# Patient Record
Sex: Female | Born: 1945
Health system: Southern US, Community
[De-identification: ages and names within clinical notes are randomized; demographics above are authoritative.]

## PROBLEM LIST (undated history)

## (undated) ENCOUNTER — Emergency Department (HOSPITAL_COMMUNITY): Payer: Medicare Other | Source: Home / Self Care

## (undated) DIAGNOSIS — H409 Unspecified glaucoma: Secondary | ICD-10-CM

## (undated) DIAGNOSIS — G5600 Carpal tunnel syndrome, unspecified upper limb: Secondary | ICD-10-CM

## (undated) DIAGNOSIS — M8580 Other specified disorders of bone density and structure, unspecified site: Secondary | ICD-10-CM

## (undated) DIAGNOSIS — M5090 Cervical disc disorder, unspecified, unspecified cervical region: Secondary | ICD-10-CM

## (undated) DIAGNOSIS — Z972 Presence of dental prosthetic device (complete) (partial): Secondary | ICD-10-CM

## (undated) DIAGNOSIS — M1990 Unspecified osteoarthritis, unspecified site: Secondary | ICD-10-CM

## (undated) DIAGNOSIS — M810 Age-related osteoporosis without current pathological fracture: Secondary | ICD-10-CM

## (undated) DIAGNOSIS — M4310 Spondylolisthesis, site unspecified: Secondary | ICD-10-CM

## (undated) DIAGNOSIS — D126 Benign neoplasm of colon, unspecified: Secondary | ICD-10-CM

## (undated) DIAGNOSIS — D709 Neutropenia, unspecified: Secondary | ICD-10-CM

## (undated) DIAGNOSIS — K4090 Unilateral inguinal hernia, without obstruction or gangrene, not specified as recurrent: Secondary | ICD-10-CM

## (undated) DIAGNOSIS — M7989 Other specified soft tissue disorders: Secondary | ICD-10-CM

## (undated) DIAGNOSIS — K219 Gastro-esophageal reflux disease without esophagitis: Secondary | ICD-10-CM

## (undated) HISTORY — PX: OTHER SURGICAL HISTORY: SHX169

## (undated) HISTORY — PX: ABDOMINAL HYSTERECTOMY: SHX81

## (undated) HISTORY — PX: SHOULDER SURGERY: SHX246

## (undated) HISTORY — PX: COLONOSCOPY: SHX174

## (undated) HISTORY — PX: HERNIA REPAIR: SHX51

---

## 1994-10-28 HISTORY — PX: ABDOMINAL HYSTERECTOMY: SHX81

## 1998-10-28 HISTORY — PX: HERNIA REPAIR: SHX51

## 2004-11-06 ENCOUNTER — Ambulatory Visit: Payer: Self-pay | Admitting: Obstetrics and Gynecology

## 2005-03-15 ENCOUNTER — Emergency Department: Payer: Self-pay | Admitting: Emergency Medicine

## 2006-01-28 ENCOUNTER — Ambulatory Visit: Payer: Self-pay | Admitting: Obstetrics and Gynecology

## 2007-02-03 ENCOUNTER — Ambulatory Visit: Payer: Self-pay | Admitting: Obstetrics and Gynecology

## 2007-04-07 ENCOUNTER — Ambulatory Visit: Payer: Self-pay | Admitting: Unknown Physician Specialty

## 2007-10-17 ENCOUNTER — Other Ambulatory Visit: Payer: Self-pay

## 2007-10-17 ENCOUNTER — Emergency Department: Payer: Self-pay | Admitting: Emergency Medicine

## 2007-10-29 HISTORY — PX: SHOULDER SURGERY: SHX246

## 2008-02-19 ENCOUNTER — Ambulatory Visit: Payer: Self-pay | Admitting: Obstetrics and Gynecology

## 2009-02-20 ENCOUNTER — Ambulatory Visit: Payer: Self-pay | Admitting: Obstetrics and Gynecology

## 2009-02-25 ENCOUNTER — Ambulatory Visit: Payer: Self-pay | Admitting: Internal Medicine

## 2009-02-28 ENCOUNTER — Ambulatory Visit: Payer: Self-pay | Admitting: Internal Medicine

## 2009-03-28 ENCOUNTER — Ambulatory Visit: Payer: Self-pay | Admitting: Internal Medicine

## 2009-05-28 ENCOUNTER — Ambulatory Visit: Payer: Self-pay | Admitting: Internal Medicine

## 2009-06-08 ENCOUNTER — Ambulatory Visit: Payer: Self-pay | Admitting: Internal Medicine

## 2009-06-28 ENCOUNTER — Ambulatory Visit: Payer: Self-pay | Admitting: Internal Medicine

## 2009-10-28 HISTORY — PX: BREAST EXCISIONAL BIOPSY: SUR124

## 2010-02-22 ENCOUNTER — Ambulatory Visit: Payer: Self-pay | Admitting: Obstetrics and Gynecology

## 2010-02-27 ENCOUNTER — Ambulatory Visit: Payer: Self-pay | Admitting: Obstetrics and Gynecology

## 2010-06-11 ENCOUNTER — Ambulatory Visit: Payer: Self-pay | Admitting: Obstetrics and Gynecology

## 2010-09-04 ENCOUNTER — Ambulatory Visit: Payer: Self-pay | Admitting: Surgery

## 2010-09-19 ENCOUNTER — Ambulatory Visit: Payer: Self-pay | Admitting: Surgery

## 2010-09-26 ENCOUNTER — Ambulatory Visit: Payer: Self-pay | Admitting: Surgery

## 2010-10-04 LAB — PATHOLOGY REPORT

## 2011-09-23 ENCOUNTER — Ambulatory Visit: Payer: Self-pay | Admitting: Obstetrics and Gynecology

## 2012-07-21 ENCOUNTER — Ambulatory Visit: Payer: Self-pay | Admitting: Unknown Physician Specialty

## 2012-09-23 ENCOUNTER — Ambulatory Visit: Payer: Self-pay | Admitting: Obstetrics and Gynecology

## 2013-01-04 ENCOUNTER — Emergency Department: Payer: Self-pay | Admitting: Internal Medicine

## 2013-01-04 LAB — CBC
HCT: 40 % (ref 35.0–47.0)
HGB: 12.7 g/dL (ref 12.0–16.0)
MCH: 28.1 pg (ref 26.0–34.0)
MCHC: 31.6 g/dL — ABNORMAL LOW (ref 32.0–36.0)
Platelet: 229 10*3/uL (ref 150–440)
RBC: 4.5 10*6/uL (ref 3.80–5.20)
WBC: 7.5 10*3/uL (ref 3.6–11.0)

## 2013-01-04 LAB — COMPREHENSIVE METABOLIC PANEL
Alkaline Phosphatase: 188 U/L — ABNORMAL HIGH (ref 50–136)
Anion Gap: 5 — ABNORMAL LOW (ref 7–16)
BUN: 8 mg/dL (ref 7–18)
Bilirubin,Total: 0.5 mg/dL (ref 0.2–1.0)
Calcium, Total: 8.7 mg/dL (ref 8.5–10.1)
Co2: 29 mmol/L (ref 21–32)
Creatinine: 0.83 mg/dL (ref 0.60–1.30)
EGFR (African American): >60
EGFR (Non-African Amer.): >60
Glucose: 105 mg/dL — ABNORMAL HIGH (ref 65–99)
Potassium: 3.5 mmol/L (ref 3.5–5.1)
SGPT (ALT): 56 U/L (ref 12–78)
Total Protein: 8 g/dL (ref 6.4–8.2)

## 2013-01-04 LAB — URINALYSIS, COMPLETE
Bilirubin,UR: NEGATIVE
Glucose,UR: NEGATIVE mg/dL (ref 0–75)
Ketone: NEGATIVE
Nitrite: NEGATIVE
Ph: 6 (ref 4.5–8.0)
RBC,UR: 7 /HPF (ref 0–5)
Specific Gravity: 1.014 (ref 1.003–1.030)

## 2013-01-04 LAB — TROPONIN I: Troponin-I: <0.02 ng/mL

## 2013-01-04 LAB — CK TOTAL AND CKMB (NOT AT ARMC): CK, Total: 108 U/L (ref 21–215)

## 2013-09-27 ENCOUNTER — Ambulatory Visit: Payer: Self-pay | Admitting: Obstetrics and Gynecology

## 2014-10-13 ENCOUNTER — Ambulatory Visit: Payer: Self-pay | Admitting: Obstetrics and Gynecology

## 2015-09-20 ENCOUNTER — Other Ambulatory Visit: Payer: Self-pay | Admitting: Obstetrics and Gynecology

## 2015-09-20 DIAGNOSIS — Z1231 Encounter for screening mammogram for malignant neoplasm of breast: Secondary | ICD-10-CM

## 2015-10-16 ENCOUNTER — Ambulatory Visit
Admission: RE | Admit: 2015-10-16 | Discharge: 2015-10-16 | Disposition: A | Discharge status: Home / Self Care | Payer: Medicare Other | Source: Ambulatory Visit | Attending: Obstetrics and Gynecology | Admitting: Obstetrics and Gynecology

## 2015-10-16 ENCOUNTER — Other Ambulatory Visit: Payer: Self-pay | Admitting: Obstetrics and Gynecology

## 2015-10-16 DIAGNOSIS — Z1231 Encounter for screening mammogram for malignant neoplasm of breast: Secondary | ICD-10-CM

## 2016-09-06 ENCOUNTER — Other Ambulatory Visit: Payer: Self-pay | Admitting: Obstetrics and Gynecology

## 2016-09-06 DIAGNOSIS — Z1231 Encounter for screening mammogram for malignant neoplasm of breast: Secondary | ICD-10-CM

## 2016-10-16 ENCOUNTER — Ambulatory Visit
Admission: RE | Admit: 2016-10-16 | Discharge: 2016-10-16 | Disposition: A | Discharge status: Home / Self Care | Payer: Medicare Other | Source: Ambulatory Visit | Attending: Obstetrics and Gynecology | Admitting: Obstetrics and Gynecology

## 2016-10-16 DIAGNOSIS — Z1231 Encounter for screening mammogram for malignant neoplasm of breast: Secondary | ICD-10-CM | POA: Diagnosis not present

## 2017-03-04 ENCOUNTER — Other Ambulatory Visit: Payer: Self-pay | Admitting: Obstetrics and Gynecology

## 2017-03-04 DIAGNOSIS — Z1231 Encounter for screening mammogram for malignant neoplasm of breast: Secondary | ICD-10-CM

## 2017-05-23 DIAGNOSIS — Z86010 Personal history of colonic polyps: Secondary | ICD-10-CM | POA: Insufficient documentation

## 2017-08-15 ENCOUNTER — Encounter: Payer: Self-pay | Admitting: *Deleted

## 2017-08-18 ENCOUNTER — Ambulatory Visit: Payer: Medicare Other | Admitting: Anesthesiology

## 2017-08-18 ENCOUNTER — Encounter: Payer: Self-pay | Admitting: *Deleted

## 2017-08-18 ENCOUNTER — Ambulatory Visit
Admission: RE | Admit: 2017-08-18 | Discharge: 2017-08-18 | Disposition: A | Discharge status: Home / Self Care | Payer: Medicare Other | Source: Ambulatory Visit | Attending: Unknown Physician Specialty | Admitting: Unknown Physician Specialty

## 2017-08-18 ENCOUNTER — Encounter
Admission: RE | Disposition: A | Discharge status: Home / Self Care | Payer: Self-pay | Source: Ambulatory Visit | Attending: Unknown Physician Specialty

## 2017-08-18 DIAGNOSIS — Z86010 Personal history of colonic polyps: Secondary | ICD-10-CM | POA: Insufficient documentation

## 2017-08-18 DIAGNOSIS — M810 Age-related osteoporosis without current pathological fracture: Secondary | ICD-10-CM | POA: Diagnosis not present

## 2017-08-18 DIAGNOSIS — D123 Benign neoplasm of transverse colon: Secondary | ICD-10-CM | POA: Insufficient documentation

## 2017-08-18 DIAGNOSIS — Z90710 Acquired absence of both cervix and uterus: Secondary | ICD-10-CM | POA: Insufficient documentation

## 2017-08-18 DIAGNOSIS — E669 Obesity, unspecified: Secondary | ICD-10-CM | POA: Insufficient documentation

## 2017-08-18 DIAGNOSIS — K640 First degree hemorrhoids: Secondary | ICD-10-CM | POA: Insufficient documentation

## 2017-08-18 DIAGNOSIS — Z1211 Encounter for screening for malignant neoplasm of colon: Secondary | ICD-10-CM | POA: Insufficient documentation

## 2017-08-18 DIAGNOSIS — M4310 Spondylolisthesis, site unspecified: Secondary | ICD-10-CM | POA: Insufficient documentation

## 2017-08-18 DIAGNOSIS — K635 Polyp of colon: Secondary | ICD-10-CM | POA: Insufficient documentation

## 2017-08-18 DIAGNOSIS — M8580 Other specified disorders of bone density and structure, unspecified site: Secondary | ICD-10-CM | POA: Diagnosis not present

## 2017-08-18 DIAGNOSIS — Z6830 Body mass index (BMI) 30.0-30.9, adult: Secondary | ICD-10-CM | POA: Insufficient documentation

## 2017-08-18 DIAGNOSIS — Z79899 Other long term (current) drug therapy: Secondary | ICD-10-CM | POA: Insufficient documentation

## 2017-08-18 DIAGNOSIS — K219 Gastro-esophageal reflux disease without esophagitis: Secondary | ICD-10-CM | POA: Insufficient documentation

## 2017-08-18 HISTORY — DX: Carpal tunnel syndrome, unspecified upper limb: G56.00

## 2017-08-18 HISTORY — DX: Neutropenia, unspecified: D70.9

## 2017-08-18 HISTORY — DX: Gastro-esophageal reflux disease without esophagitis: K21.9

## 2017-08-18 HISTORY — DX: Other specified disorders of bone density and structure, unspecified site: M85.80

## 2017-08-18 HISTORY — DX: Other specified soft tissue disorders: M79.89

## 2017-08-18 HISTORY — PX: COLONOSCOPY WITH PROPOFOL: SHX5780

## 2017-08-18 HISTORY — DX: Age-related osteoporosis without current pathological fracture: M81.0

## 2017-08-18 HISTORY — DX: Spondylolisthesis, site unspecified: M43.10

## 2017-08-18 HISTORY — DX: Cervical disc disorder, unspecified, unspecified cervical region: M50.90

## 2017-08-18 HISTORY — DX: Benign neoplasm of colon, unspecified: D12.6

## 2017-08-18 SURGERY — COLONOSCOPY WITH PROPOFOL
Anesthesia: General

## 2017-08-18 MED ORDER — MIDAZOLAM HCL 2 MG/2ML IJ SOLN
INTRAMUSCULAR | Status: AC
  Filled 2017-08-18: qty 2

## 2017-08-18 MED ORDER — EPHEDRINE SULFATE 50 MG/ML IJ SOLN
INTRAMUSCULAR | Status: AC
  Filled 2017-08-18: qty 1

## 2017-08-18 MED ORDER — ATROPINE SULFATE 0.4 MG/ML IJ SOLN
INTRAMUSCULAR | Status: AC
  Filled 2017-08-18: qty 1

## 2017-08-18 MED ORDER — FENTANYL CITRATE (PF) 100 MCG/2ML IJ SOLN
INTRAMUSCULAR | Status: DC | PRN
  Administered 2017-08-18: 50 ug via INTRAVENOUS

## 2017-08-18 MED ORDER — PROPOFOL 500 MG/50ML IV EMUL
INTRAVENOUS | Status: DC | PRN
  Administered 2017-08-18: 180 ug/kg/min via INTRAVENOUS

## 2017-08-18 MED ORDER — LIDOCAINE HCL (PF) 2 % IJ SOLN
INTRAMUSCULAR | Status: AC
  Filled 2017-08-18: qty 10

## 2017-08-18 MED ORDER — PROPOFOL 10 MG/ML IV BOLUS
INTRAVENOUS | Status: AC
  Filled 2017-08-18: qty 20

## 2017-08-18 MED ORDER — EPHEDRINE SULFATE 50 MG/ML IJ SOLN
INTRAMUSCULAR | Status: DC | PRN
  Administered 2017-08-18: 10 mg via INTRAVENOUS

## 2017-08-18 MED ORDER — PROPOFOL 10 MG/ML IV BOLUS
INTRAVENOUS | Status: DC | PRN
  Administered 2017-08-18: 40 mg via INTRAVENOUS

## 2017-08-18 MED ORDER — SODIUM CHLORIDE 0.9 % IV SOLN: INTRAVENOUS | Status: DC

## 2017-08-18 MED ORDER — MIDAZOLAM HCL 2 MG/2ML IJ SOLN
INTRAMUSCULAR | Status: DC | PRN
  Administered 2017-08-18: 1 mg via INTRAVENOUS

## 2017-08-18 MED ORDER — SODIUM CHLORIDE 0.9 % IV SOLN
INTRAVENOUS | Status: DC
  Administered 2017-08-18: 1000 mL via INTRAVENOUS
  Administered 2017-08-18: 08:00:00 via INTRAVENOUS

## 2017-08-18 MED ORDER — FENTANYL CITRATE (PF) 100 MCG/2ML IJ SOLN
INTRAMUSCULAR | Status: AC
  Filled 2017-08-18: qty 2

## 2017-08-18 NOTE — Anesthesia Post-op Follow-up Note (Signed)
Anesthesia QCDR form completed.        

## 2017-08-18 NOTE — Anesthesia Procedure Notes (Signed)
Date/Time: 08/18/2017 7:45 AM Performed by: Allean Found Pre-anesthesia Checklist: Patient identified, Emergency Drugs available, Suction available, Patient being monitored and Timeout performed Patient Re-evaluated:Patient Re-evaluated prior to induction Oxygen Delivery Method: Nasal cannula Placement Confirmation: positive ETCO2

## 2017-08-18 NOTE — Op Note (Signed)
[facility] Gastroenterology Patient Name: Providence Stivers Procedure Date: 08/18/2017 7:32 AM MRN: [id number] Account #: [id number] Date of Birth: Jan 02, 1946 Admit Type: Outpatient Age: 71 Room: [facility] ENDO ROOM 3 Gender: Female Note Status: Finalized Procedure:            Colonoscopy Indications:          High risk colon cancer surveillance: Personal history                        of colonic polyps Providers:            Manya Silvas, MD Referring MD:         Boykin Nearing, MD (Referring MD) Medicines:            Propofol per Anesthesia Complications:        No immediate complications. Procedure:            Pre-Anesthesia Assessment:                       - After reviewing the risks and benefits, the patient                        was deemed in satisfactory condition to undergo the                        procedure.                       After obtaining informed consent, the colonoscope was                        passed under direct vision. Throughout the procedure,                        the patient's blood pressure, pulse, and oxygen                        saturations were monitored continuously. The                        Colonoscope was introduced through the anus and                        advanced to the the cecum, identified by appendiceal                        orifice and ileocecal valve. The colonoscopy was                        performed without difficulty. The patient tolerated the                        procedure well. The quality of the bowel preparation                        was excellent. Findings:      A diminutive polyp was found in the transverse colon. The polyp was       sessile. The polyp was removed with a jumbo cold forceps. Resection and       retrieval were complete.      A diminutive polyp was  found in the transverse colon. The polyp was       sessile. The polyp was removed with a jumbo cold forceps. Resection and   retrieval were complete. To prevent bleeding after the polypectomy, one       hemostatic clip was successfully placed. There was no bleeding at the       end of the procedure.      A diminutive polyp was found in the splenic flexure. The polyp was       sessile. The polyp was removed with a jumbo cold forceps. Resection and       retrieval were complete.      Internal hemorrhoids were found during endoscopy. The hemorrhoids were       small and Grade I (internal hemorrhoids that do not prolapse).      The exam was otherwise without abnormality. Impression:           - One diminutive polyp in the transverse colon, removed                        with a jumbo cold forceps. Resected and retrieved.                       - One diminutive polyp in the transverse colon, removed                        with a jumbo cold forceps. Resected and retrieved. Clip                        was placed.                       - One diminutive polyp at the splenic flexure, removed                        with a jumbo cold forceps. Resected and retrieved.                       - Internal hemorrhoids.                       - The examination was otherwise normal. Recommendation:       - Await pathology results. Manya Silvas, MD 08/18/2017 8:05:56 AM This report has been signed electronically. Number of Addenda: 0 Note Initiated On: 08/18/2017 7:32 AM Scope Withdrawal Time: 0 hours 15 minutes 54 seconds  Total Procedure Duration: 0 hours 23 minutes 25 seconds       [facility]

## 2017-08-18 NOTE — Anesthesia Postprocedure Evaluation (Signed)
Anesthesia Post Note  Patient: Rebecca Salinas  Procedure(s) Performed: COLONOSCOPY WITH PROPOFOL (N/A )  Patient location during evaluation: Endoscopy Anesthesia Type: General Level of consciousness: awake and alert and oriented Pain management: pain level controlled Vital Signs Assessment: post-procedure vital signs reviewed and stable Respiratory status: spontaneous breathing, nonlabored ventilation and respiratory function stable Cardiovascular status: blood pressure returned to baseline and stable Postop Assessment: no signs of nausea or vomiting Anesthetic complications: no     Last Vitals:  Vitals:   08/18/17 0827 08/18/17 0837  BP: 134/83 (!) 144/82  Pulse: 65 60  Resp: 12 13  Temp:    SpO2: 100% 100%    Last Pain:  Vitals:   08/18/17 0807  TempSrc: Tympanic                 [name]

## 2017-08-18 NOTE — Transfer of Care (Signed)
Immediate Anesthesia Transfer of Care Note  Patient: Amiaya L Orona  Procedure(s) Performed: COLONOSCOPY WITH PROPOFOL (N/A )  Patient Location: PACU  Anesthesia Type:General  Level of Consciousness: sedated  Airway & Oxygen Therapy: Patient Spontanous Breathing and Patient connected to nasal cannula oxygen  Post-op Assessment: Report given to RN and Post -op Vital signs reviewed and stable  Post vital signs: Reviewed and stable  Last Vitals:  Vitals:   08/18/17 0708 08/18/17 0807  BP: (!) 163/97 113/74  Pulse: 83 84  Resp: 16 20  Temp: (!) 36.1 C (!) 35.6 C  SpO2: 96% 100%    Last Pain:  Vitals:   08/18/17 0807  TempSrc: Tympanic         Complications: No apparent anesthesia complications

## 2017-08-18 NOTE — H&P (Signed)
   Primary Care Physician:  Luna Glasgow, DO Primary Gastroenterologist:  Dr. Vira Agar  Pre-Procedure History & Physical: HPI:  Rebecca Salinas is a 71 y.o. female is here for an colonoscopy.   Past Medical History:  Diagnosis Date  . Carpal tunnel syndrome   . Cervical disc disease   . Colon adenomas   . Fat necrosis   . GERD (gastroesophageal reflux disease)   . Neutropenia ([place])   . Osteopenia   . Osteoporosis   . Spondylolisthesis     Past Surgical History:  Procedure Laterality Date  . ABDOMINAL HYSTERECTOMY    . BREAST BIOPSY Left 2011   negative  . COLONOSCOPY    . HERNIA REPAIR    . lavh    . SHOULDER SURGERY Right     Prior to Admission medications   Medication Sig Start Date End Date Taking? Authorizing Provider  esomeprazole (NEXIUM) 40 MG capsule Take 40 mg by mouth daily at 12 noon.   Yes [provider]  MULTIPLE VITAMIN PO Take 1 tablet by mouth daily.   Yes [provider]  polyethylene glycol powder (GLYCOLAX/MIRALAX) powder Take 1 Container by mouth once.   Yes [provider]  ranitidine (ZANTAC) 150 MG tablet Take 150 mg by mouth 2 (two) times daily.   Yes [provider]    Allergies as of 05/30/2017  . (Not on File)    History reviewed. No pertinent family history.  Social History   Social History  . Marital status: Married    Spouse name: N/A  . Number of children: N/A  . Years of education: N/A   Occupational History  . Not on file.   Social History Main Topics  . Smoking status: Never Smoker  . Smokeless tobacco: Never Used  . Alcohol use Yes     Comment: rare  . Drug use: No  . Sexual activity: Not on file   Other Topics Concern  . Not on file   Social History Narrative  . No narrative on file    Review of Systems: See HPI, otherwise negative ROS  Physical Exam: BP (!) 163/97   Pulse 83   Temp (!) 97 F (36.1 C) (Tympanic)   Resp 16   Ht 5\' 2"  (1.575 m)   Wt 75.3 kg (166  lb)   SpO2 96%   BMI 30.36 kg/m  General:   Alert,  pleasant and cooperative in NAD Head:  Normocephalic and atraumatic. Neck:  Supple; no masses or thyromegaly. Lungs:  Clear throughout to auscultation.    Heart:  Regular rate and rhythm. Abdomen:  Soft, nontender and nondistended. Normal bowel sounds, without guarding, and without rebound.   Neurologic:  Alert and  oriented x4;  grossly normal neurologically.  Impression/Plan: Rebecca Salinas is here for an colonoscopy to be performed for Personal history of colon polyps  Risks, benefits, limitations, and alternatives regarding  colonoscopy have been reviewed with the patient.  Questions have been answered.  All parties agreeable.   Gaylyn Cheers, MD  08/18/2017, 7:25 AM

## 2017-08-18 NOTE — Anesthesia Preprocedure Evaluation (Signed)
Anesthesia Evaluation  Patient identified by MRN, date of birth, ID band Patient awake    Reviewed: Allergy & Precautions, NPO status , Patient's Chart, lab work & pertinent test results  History of Anesthesia Complications Negative for: history of anesthetic complications  Airway Mallampati: I  TM Distance: >3 FB Neck ROM: Full    Dental  (+) Upper Dentures   Pulmonary neg pulmonary ROS, neg sleep apnea, neg COPD,    breath sounds clear to auscultation- rhonchi (-) wheezing      Cardiovascular Exercise Tolerance: Good (-) hypertension(-) CAD, (-) Past MI and (-) Cardiac Stents  Rhythm:Regular Rate:Normal - Systolic murmurs and - Diastolic murmurs    Neuro/Psych negative neurological ROS  negative psych ROS   GI/Hepatic Neg liver ROS, GERD  ,  Endo/Other  negative endocrine ROSneg diabetes  Renal/GU negative Renal ROS     Musculoskeletal negative musculoskeletal ROS (+)   Abdominal (+) + obese,   Peds  Hematology negative hematology ROS (+)   Anesthesia Other Findings Past Medical History: No date: Carpal tunnel syndrome No date: Cervical disc disease No date: Colon adenomas No date: Fat necrosis No date: GERD (gastroesophageal reflux disease) No date: Neutropenia (HCC) No date: Osteopenia No date: Osteoporosis No date: Spondylolisthesis   Reproductive/Obstetrics                             Anesthesia Physical Anesthesia Plan  ASA: II  Anesthesia Plan: General   Post-op Pain Management:    Induction: Intravenous  PONV Risk Score and Plan: 2 and Propofol infusion  Airway Management Planned: Natural Airway  Additional Equipment:   Intra-op Plan:   Post-operative Plan:   Informed Consent: I have reviewed the patients History and Physical, chart, labs and discussed the procedure including the risks, benefits and alternatives for the proposed anesthesia with the patient  or authorized representative who has indicated his/her understanding and acceptance.   Dental advisory given  Plan Discussed with: CRNA and Anesthesiologist  Anesthesia Plan Comments:         Anesthesia Quick Evaluation

## 2017-08-19 ENCOUNTER — Encounter: Payer: Self-pay | Admitting: Unknown Physician Specialty

## 2017-08-19 LAB — SURGICAL PATHOLOGY

## 2017-10-01 ENCOUNTER — Ambulatory Visit
Admission: RE | Admit: 2017-10-01 | Discharge: 2017-10-01 | Disposition: A | Discharge status: Home / Self Care | Payer: Medicare Other | Source: Ambulatory Visit | Attending: Obstetrics and Gynecology | Admitting: Obstetrics and Gynecology

## 2017-10-01 DIAGNOSIS — Z1231 Encounter for screening mammogram for malignant neoplasm of breast: Secondary | ICD-10-CM | POA: Diagnosis not present

## 2018-03-11 ENCOUNTER — Other Ambulatory Visit: Payer: Self-pay | Admitting: Obstetrics and Gynecology

## 2018-03-11 DIAGNOSIS — Z1231 Encounter for screening mammogram for malignant neoplasm of breast: Secondary | ICD-10-CM

## 2018-07-15 ENCOUNTER — Other Ambulatory Visit: Payer: Self-pay | Admitting: Nurse Practitioner

## 2018-07-15 DIAGNOSIS — R1013 Epigastric pain: Secondary | ICD-10-CM

## 2018-07-15 DIAGNOSIS — R112 Nausea with vomiting, unspecified: Secondary | ICD-10-CM

## 2018-07-21 ENCOUNTER — Ambulatory Visit
Admission: RE | Admit: 2018-07-21 | Discharge: 2018-07-21 | Disposition: A | Discharge status: Home / Self Care | Payer: Medicare Other | Source: Ambulatory Visit | Attending: Nurse Practitioner | Admitting: Nurse Practitioner

## 2018-07-21 DIAGNOSIS — R1013 Epigastric pain: Secondary | ICD-10-CM | POA: Insufficient documentation

## 2018-07-21 DIAGNOSIS — R112 Nausea with vomiting, unspecified: Secondary | ICD-10-CM | POA: Insufficient documentation

## 2018-07-21 MED ORDER — IOPAMIDOL (ISOVUE-300) INJECTION 61%
100.0000 mL | Freq: Once | INTRAVENOUS | Status: AC | PRN
  Administered 2018-07-21: 100 mL via INTRAVENOUS

## 2018-08-20 DIAGNOSIS — K219 Gastro-esophageal reflux disease without esophagitis: Secondary | ICD-10-CM | POA: Diagnosis present

## 2018-10-05 ENCOUNTER — Ambulatory Visit
Admission: RE | Admit: 2018-10-05 | Discharge: 2018-10-05 | Disposition: A | Discharge status: Home / Self Care | Payer: Medicare Other | Source: Ambulatory Visit | Attending: Obstetrics and Gynecology | Admitting: Obstetrics and Gynecology

## 2018-10-05 DIAGNOSIS — Z1231 Encounter for screening mammogram for malignant neoplasm of breast: Secondary | ICD-10-CM | POA: Diagnosis present

## 2018-10-28 HISTORY — PX: HERNIA REPAIR: SHX51

## 2018-11-05 ENCOUNTER — Ambulatory Visit: Payer: Self-pay | Admitting: Surgery

## 2018-11-05 NOTE — H&P (Signed)
Subjective:   CC:   HPI:  Rebecca Salinas is a 73 y.o. female who returns to discuss hernia repair.  Initial consult was for the incarcerated ventral hernia but since noting the incidental left recurrent inguinal, it has started to cause her more pain, especially when wearing tighter pants.   ROS:  General: Denies weight loss, weight gain, fatigue, fevers, chills, and night sweats. Heart: Denies chest pain, palpitations, racing heart, irregular heartbeat, leg pain or swelling, and decreased activity tolerance. Respiratory: Denies breathing difficulty, shortness of breath, wheezing, cough, and sputum. GI: Denies change in appetite, heartburn, nausea, vomiting, constipation, diarrhea, and blood in stool. GU: Denies difficulty urinating, pain with urinating, urgency, frequency, blood in urine   Objective:   BP (!) 157/91   Pulse 77   Temp 36.4 C (97.5 F) (Oral)   Ht 157.5 cm (5' 2.01")   Wt 73.3 kg (161 lb 9.6 oz)   BMI 29.55 kg/m   Constitutional :  alert, appears stated age, cooperative and no distress  Lymphatics/Throat:  no asymmetry, masses, or scars  Respiratory:  clear to auscultation bilaterally  Cardiovascular:  regular rate and rhythm  Gastrointestinal: soft, non-tender; bowel sounds normal; no masses,  no organomegaly. ventral hernia noted.  small, non-reducible roughly 3cm below xyphoid.  Palpable as vague lump with minimal pressure with palpation.  No palpable inguinal hernias but does have focal tenderness right over external ring on left side, not so much on right  Musculoskeletal: Steady gait and movement  Skin: Cool and moist  Psychiatric: Normal affect, non-agitated, not confused       LABS:  n/a   RADS: CT a/p report and images reviewed by myself and although report does not note it, there is an obvious ventral hernia with fat contents at the area over the palpable fullness on exam. Assessment:       Unilateral recurrent inguinal hernia without  obstruction or gangrene [K40.91]  Plan:   1. Unilateral recurrent inguinal hernia without obstruction or gangrene [K40.91]   Discussed the risk of surgery including recurrence, which can be up to 50% in the case of incisional or complex hernias, possible use of prosthetic materials (mesh) and the increased risk of mesh infxn if used, bleeding, chronic pain, post-op infxn, post-op SBO or ileus, and possible re-operation to address said risks. The risks of general anesthetic, if used, includes MI, CVA, sudden death or even reaction to anesthetic medications also discussed. Alternatives include continued observation.  Benefits include possible symptom relief, prevention of incarceration, strangulation, enlargement in size over time, and the risk of emergency surgery in the face of strangulation.   Typical post-op recovery time of 3-5 days with 4-6 weeks of activity restrictions were also discussed.  ED return precautions given for sudden increase in pain, size of hernia with accompanying fever, nausea, and/or vomiting.  The patient verbalized understanding and all questions were answered to the patient's satisfaction.   2. Do to increased symptoms left inguinal region, will proceed with left inguinal hernia first (open), then consider ventral hernia down the road after she recovers.  She verbalized understanding    Electronically signed by Benjamine Sprague, DO on 11/04/2018 3:37 PM

## 2018-11-05 NOTE — H&P (View-Only) (Signed)
Subjective:   CC:   HPI:  Rebecca Salinas is a 73 y.o. female who returns to discuss hernia repair.  Initial consult was for the incarcerated ventral hernia but since noting the incidental left recurrent inguinal, it has started to cause her more pain, especially when wearing tighter pants.   ROS:  General: Denies weight loss, weight gain, fatigue, fevers, chills, and night sweats. Heart: Denies chest pain, palpitations, racing heart, irregular heartbeat, leg pain or swelling, and decreased activity tolerance. Respiratory: Denies breathing difficulty, shortness of breath, wheezing, cough, and sputum. GI: Denies change in appetite, heartburn, nausea, vomiting, constipation, diarrhea, and blood in stool. GU: Denies difficulty urinating, pain with urinating, urgency, frequency, blood in urine   Objective:   BP (!) 157/91   Pulse 77   Temp 36.4 C (97.5 F) (Oral)   Ht 157.5 cm (5' 2.01")   Wt 73.3 kg (161 lb 9.6 oz)   BMI 29.55 kg/m   Constitutional :  alert, appears stated age, cooperative and no distress  Lymphatics/Throat:  no asymmetry, masses, or scars  Respiratory:  clear to auscultation bilaterally  Cardiovascular:  regular rate and rhythm  Gastrointestinal: soft, non-tender; bowel sounds normal; no masses,  no organomegaly. ventral hernia noted.  small, non-reducible roughly 3cm below xyphoid.  Palpable as vague lump with minimal pressure with palpation.  No palpable inguinal hernias but does have focal tenderness right over external ring on left side, not so much on right  Musculoskeletal: Steady gait and movement  Skin: Cool and moist  Psychiatric: Normal affect, non-agitated, not confused       LABS:  n/a   RADS: CT a/p report and images reviewed by myself and although report does not note it, there is an obvious ventral hernia with fat contents at the area over the palpable fullness on exam. Assessment:       Unilateral recurrent inguinal hernia without  obstruction or gangrene [K40.91]  Plan:   1. Unilateral recurrent inguinal hernia without obstruction or gangrene [K40.91]   Discussed the risk of surgery including recurrence, which can be up to 50% in the case of incisional or complex hernias, possible use of prosthetic materials (mesh) and the increased risk of mesh infxn if used, bleeding, chronic pain, post-op infxn, post-op SBO or ileus, and possible re-operation to address said risks. The risks of general anesthetic, if used, includes MI, CVA, sudden death or even reaction to anesthetic medications also discussed. Alternatives include continued observation.  Benefits include possible symptom relief, prevention of incarceration, strangulation, enlargement in size over time, and the risk of emergency surgery in the face of strangulation.   Typical post-op recovery time of 3-5 days with 4-6 weeks of activity restrictions were also discussed.  ED return precautions given for sudden increase in pain, size of hernia with accompanying fever, nausea, and/or vomiting.  The patient verbalized understanding and all questions were answered to the patient's satisfaction.   2. Do to increased symptoms left inguinal region, will proceed with left inguinal hernia first (open), then consider ventral hernia down the road after she recovers.  She verbalized understanding    Electronically signed by Benjamine Sprague, DO on 11/04/2018 3:37 PM

## 2018-11-16 ENCOUNTER — Encounter
Admission: RE | Admit: 2018-11-16 | Discharge: 2018-11-16 | Disposition: A | Discharge status: Home / Self Care | Payer: Medicare Other | Source: Ambulatory Visit | Attending: Surgery | Admitting: Surgery

## 2018-11-16 ENCOUNTER — Other Ambulatory Visit: Payer: Self-pay

## 2018-11-16 HISTORY — DX: Unspecified osteoarthritis, unspecified site: M19.90

## 2018-11-16 NOTE — Patient Instructions (Signed)
Your procedure is scheduled on: 11-19-18 THURSDAY Report to Same Day Surgery 2nd floor medical mall [facility] Entrance-take elevator on left to 2nd floor.  Check in with surgery information desk.) To find out your arrival time please call [phone] between 1PM - 3PM on 11-18-18 [facility]  Remember: Instructions that are not followed completely may result in serious medical risk, up to and including death, or upon the discretion of your surgeon and anesthesiologist your surgery may need to be rescheduled.    _x___ 1. Do not eat food after midnight the night before your procedure. NO GUM OR CANDY AFTER MIDNIGHT. You may drink clear liquids up to 2 hours before you are scheduled to arrive at the hospital for your procedure.  Do not drink clear liquids within 2 hours of your scheduled arrival to the hospital.  Clear liquids include  --Water or Apple juice without pulp  --Clear carbohydrate beverage such as ClearFast or Gatorade  --Black Coffee or Clear Tea (No milk, no creamers, do not add anything to  the coffee or Tea   ____Ensure clear carbohydrate drink on the way to the hospital for bariatric patients  ____Ensure clear carbohydrate drink 3 hours before surgery for Dr Dwyane Luo patients if physician instructed.    __x__ 2. No Alcohol for 24 hours before or after surgery.   __x__3. No Smoking or e-cigarettes for 24 prior to surgery.  Do not use any chewable tobacco products for at least 6 hour prior to surgery   ____  4. Bring all medications with you on the day of surgery if instructed.    __x__ 5. Notify your doctor if there is any change in your medical condition     (cold, fever, infections).    x___6. On the morning of surgery brush your teeth with toothpaste and water.  You may rinse your mouth with mouth wash if you wish.  Do not swallow any toothpaste or mouthwash.   Do not wear jewelry, make-up, hairpins, clips or nail polish.  Do not wear lotions, powders, or perfumes.  You may wear deodorant.  Do not shave 48 hours prior to surgery. Men may shave face and neck.  Do not bring valuables to the hospital.    [facility] is not responsible for any belongings or valuables.               Contacts, dentures or bridgework may not be worn into surgery.  Leave your suitcase in the car. After surgery it may be brought to your room.  For patients admitted to the hospital, discharge time is determined by your treatment team.  _  Patients discharged the day of surgery will not be allowed to drive home.  You will need someone to drive you home and stay with you the night of your procedure.    Please read over the following fact sheets that you were given:   [facility] Preparing for Surgery   _x___ TAKE THE FOLLOWING MEDICATION THE MORNING OF SURGERY WITH A SMALL SIP OF WATER. These include:  1. [place]  2. TAKE AN EXTRA NEXIUM THE NIGHT BEFORE YOUR SURGERY  3.  4.  5.  6.  ____Fleets enema or Magnesium Citrate as directed.   _x___ Use CHG Soap or sage wipes as directed on instruction sheet   ____ Use inhalers on the day of surgery and bring to hospital day of surgery  ____ Stop Metformin and Janumet 2 days prior to surgery.    ____ Take  1/2 of usual insulin dose the night before surgery and none on the morning surgery.   ____ Follow recommendations from Cardiologist, Pulmonologist or PCP regarding  stopping Aspirin, Coumadin, Plavix ,Eliquis, Effient, or Pradaxa, and Pletal.  X____Stop Anti-inflammatories such as Advil, Aleve, Ibuprofen, Motrin, Naproxen, Naprosyn, Goodies powders or aspirin products NOW-OK to take Tylenol    _x___ Stop supplements until after surgery-STOP VITAMIN C NOW-MAY RESUME AFTER SURGERY   ____ Bring C-Pap to the hospital.

## 2018-11-17 ENCOUNTER — Encounter
Admission: RE | Admit: 2018-11-17 | Discharge: 2018-11-17 | Disposition: A | Discharge status: Home / Self Care | Payer: Medicare Other | Source: Ambulatory Visit | Attending: Surgery | Admitting: Surgery

## 2018-11-17 DIAGNOSIS — Z01818 Encounter for other preprocedural examination: Secondary | ICD-10-CM

## 2018-11-17 DIAGNOSIS — K219 Gastro-esophageal reflux disease without esophagitis: Secondary | ICD-10-CM | POA: Diagnosis not present

## 2018-11-17 DIAGNOSIS — K4091 Unilateral inguinal hernia, without obstruction or gangrene, recurrent: Secondary | ICD-10-CM | POA: Diagnosis not present

## 2018-11-17 DIAGNOSIS — Z79899 Other long term (current) drug therapy: Secondary | ICD-10-CM | POA: Diagnosis not present

## 2018-11-18 ENCOUNTER — Encounter: Payer: Self-pay | Admitting: Anesthesiology

## 2018-11-18 MED ORDER — CEFAZOLIN SODIUM-DEXTROSE 2-4 GM/100ML-% IV SOLN
2.0000 g | INTRAVENOUS | Status: AC
  Administered 2018-11-19: 2 g via INTRAVENOUS

## 2018-11-19 ENCOUNTER — Ambulatory Visit: Payer: Medicare Other | Admitting: Anesthesiology

## 2018-11-19 ENCOUNTER — Ambulatory Visit
Admission: RE | Admit: 2018-11-19 | Discharge: 2018-11-19 | Disposition: A | Discharge status: Home / Self Care | Payer: Medicare Other | Attending: Surgery | Admitting: Surgery

## 2018-11-19 ENCOUNTER — Encounter: Payer: Self-pay | Admitting: Anesthesiology

## 2018-11-19 ENCOUNTER — Encounter
Admission: RE | Disposition: A | Discharge status: Home / Self Care | Payer: Self-pay | Source: Home / Self Care | Attending: Surgery

## 2018-11-19 ENCOUNTER — Other Ambulatory Visit: Payer: Self-pay

## 2018-11-19 DIAGNOSIS — K4091 Unilateral inguinal hernia, without obstruction or gangrene, recurrent: Secondary | ICD-10-CM | POA: Insufficient documentation

## 2018-11-19 DIAGNOSIS — Z79899 Other long term (current) drug therapy: Secondary | ICD-10-CM | POA: Insufficient documentation

## 2018-11-19 DIAGNOSIS — K219 Gastro-esophageal reflux disease without esophagitis: Secondary | ICD-10-CM | POA: Insufficient documentation

## 2018-11-19 HISTORY — PX: INGUINAL HERNIA REPAIR: SHX194

## 2018-11-19 SURGERY — REPAIR, HERNIA, INGUINAL, ADULT
Anesthesia: General | Laterality: Left

## 2018-11-19 MED ORDER — GLYCOPYRROLATE 0.2 MG/ML IJ SOLN
INTRAMUSCULAR | Status: DC | PRN
  Administered 2018-11-19: 0.2 mg via INTRAVENOUS

## 2018-11-19 MED ORDER — HYDROMORPHONE HCL 1 MG/ML IJ SOLN
INTRAMUSCULAR | Status: AC
  Filled 2018-11-19: qty 1

## 2018-11-19 MED ORDER — ACETAMINOPHEN 500 MG PO TABS
1000.0000 mg | ORAL_TABLET | ORAL | Status: AC
  Administered 2018-11-19: 1000 mg via ORAL

## 2018-11-19 MED ORDER — ONDANSETRON HCL 4 MG/2ML IJ SOLN: 4.0000 mg | Freq: Once | INTRAMUSCULAR | Status: DC | PRN

## 2018-11-19 MED ORDER — MIDAZOLAM HCL 2 MG/2ML IJ SOLN
INTRAMUSCULAR | Status: AC
  Filled 2018-11-19: qty 2

## 2018-11-19 MED ORDER — PROPOFOL 10 MG/ML IV BOLUS
INTRAVENOUS | Status: AC
  Filled 2018-11-19: qty 20

## 2018-11-19 MED ORDER — FENTANYL CITRATE (PF) 100 MCG/2ML IJ SOLN: 25.0000 ug | INTRAMUSCULAR | Status: DC | PRN

## 2018-11-19 MED ORDER — CEFAZOLIN SODIUM-DEXTROSE 2-4 GM/100ML-% IV SOLN
INTRAVENOUS | Status: AC
  Filled 2018-11-19: qty 100

## 2018-11-19 MED ORDER — DOCUSATE SODIUM 100 MG PO CAPS: 100.0000 mg | ORAL_CAPSULE | Freq: Two times a day (BID) | ORAL | 0 refills | Status: AC | PRN

## 2018-11-19 MED ORDER — VASOPRESSIN 20 UNIT/ML IV SOLN
INTRAVENOUS | Status: AC
  Filled 2018-11-19: qty 1

## 2018-11-19 MED ORDER — DEXAMETHASONE SODIUM PHOSPHATE 10 MG/ML IJ SOLN
INTRAMUSCULAR | Status: DC | PRN
  Administered 2018-11-19: 5 mg via INTRAVENOUS

## 2018-11-19 MED ORDER — ACETAMINOPHEN 325 MG PO TABS: 650.0000 mg | ORAL_TABLET | Freq: Three times a day (TID) | ORAL | 0 refills | Status: AC | PRN

## 2018-11-19 MED ORDER — LIDOCAINE HCL (PF) 2 % IJ SOLN
INTRAMUSCULAR | Status: AC
  Filled 2018-11-19: qty 10

## 2018-11-19 MED ORDER — CHLORHEXIDINE GLUCONATE CLOTH 2 % EX PADS: 6.0000 | MEDICATED_PAD | Freq: Once | CUTANEOUS | Status: DC

## 2018-11-19 MED ORDER — KETOROLAC TROMETHAMINE 30 MG/ML IJ SOLN
INTRAMUSCULAR | Status: DC | PRN
  Administered 2018-11-19: 30 mg via INTRAVENOUS

## 2018-11-19 MED ORDER — PHENYLEPHRINE HCL 10 MG/ML IJ SOLN
INTRAMUSCULAR | Status: DC | PRN
  Administered 2018-11-19: 100 ug via INTRAVENOUS
  Administered 2018-11-19 (×2): 200 ug via INTRAVENOUS
  Administered 2018-11-19: 50 ug via INTRAVENOUS
  Administered 2018-11-19: 200 ug via INTRAVENOUS

## 2018-11-19 MED ORDER — LIDOCAINE HCL (CARDIAC) PF 100 MG/5ML IV SOSY
PREFILLED_SYRINGE | INTRAVENOUS | Status: DC | PRN
  Administered 2018-11-19: 100 mg via INTRAVENOUS

## 2018-11-19 MED ORDER — HYDROMORPHONE HCL 1 MG/ML IJ SOLN
INTRAMUSCULAR | Status: DC | PRN
  Administered 2018-11-19: .6 mg via INTRAVENOUS

## 2018-11-19 MED ORDER — LACTATED RINGERS IV SOLN
INTRAVENOUS | Status: DC | PRN
  Administered 2018-11-19: 14:00:00 via INTRAVENOUS

## 2018-11-19 MED ORDER — SUCCINYLCHOLINE CHLORIDE 20 MG/ML IJ SOLN
INTRAMUSCULAR | Status: AC
  Filled 2018-11-19: qty 1

## 2018-11-19 MED ORDER — ACETAMINOPHEN 500 MG PO TABS
ORAL_TABLET | ORAL | Status: AC
  Filled 2018-11-19: qty 2

## 2018-11-19 MED ORDER — ROCURONIUM BROMIDE 50 MG/5ML IV SOLN
INTRAVENOUS | Status: AC
  Filled 2018-11-19: qty 1

## 2018-11-19 MED ORDER — BUPIVACAINE LIPOSOME 1.3 % IJ SUSP
INTRAMUSCULAR | Status: DC | PRN
  Administered 2018-11-19: 20 mL
  Administered 2018-11-19: 7 mL

## 2018-11-19 MED ORDER — DEXAMETHASONE SODIUM PHOSPHATE 10 MG/ML IJ SOLN
INTRAMUSCULAR | Status: AC
  Filled 2018-11-19: qty 1

## 2018-11-19 MED ORDER — ONDANSETRON HCL 4 MG/2ML IJ SOLN
INTRAMUSCULAR | Status: DC | PRN
  Administered 2018-11-19: 4 mg via INTRAVENOUS

## 2018-11-19 MED ORDER — FENTANYL CITRATE (PF) 100 MCG/2ML IJ SOLN
INTRAMUSCULAR | Status: DC | PRN
  Administered 2018-11-19 (×2): 25 ug via INTRAVENOUS

## 2018-11-19 MED ORDER — LACTATED RINGERS IV SOLN
INTRAVENOUS | Status: DC
  Administered 2018-11-19: 09:00:00 via INTRAVENOUS

## 2018-11-19 MED ORDER — PROPOFOL 10 MG/ML IV BOLUS
INTRAVENOUS | Status: DC | PRN
  Administered 2018-11-19 (×3): 50 mg via INTRAVENOUS

## 2018-11-19 MED ORDER — FENTANYL CITRATE (PF) 100 MCG/2ML IJ SOLN
INTRAMUSCULAR | Status: AC
  Filled 2018-11-19: qty 2

## 2018-11-19 MED ORDER — HYDROCODONE-ACETAMINOPHEN 5-325 MG PO TABS: 1.0000 | ORAL_TABLET | Freq: Four times a day (QID) | ORAL | 0 refills | Status: AC | PRN

## 2018-11-19 MED ORDER — ONDANSETRON HCL 4 MG/2ML IJ SOLN
INTRAMUSCULAR | Status: AC
  Filled 2018-11-19: qty 2

## 2018-11-19 MED ORDER — MIDAZOLAM HCL 2 MG/2ML IJ SOLN
INTRAMUSCULAR | Status: DC | PRN
  Administered 2018-11-19: 2 mg via INTRAVENOUS

## 2018-11-19 SURGICAL SUPPLY — 36 items
BLADE CLIPPER SURG (BLADE) ×3 IMPLANT
BLADE SURG 15 STRL LF DISP TIS (BLADE) ×1 IMPLANT
BLADE SURG 15 STRL SS (BLADE) ×2
CANISTER SUCT 1200ML W/VALVE (MISCELLANEOUS) ×3 IMPLANT
CHLORAPREP W/TINT 26ML (MISCELLANEOUS) ×3 IMPLANT
COVER WAND RF STERILE (DRAPES) IMPLANT
DERMABOND ADVANCED (GAUZE/BANDAGES/DRESSINGS) ×2
DERMABOND ADVANCED .7 DNX12 (GAUZE/BANDAGES/DRESSINGS) ×1 IMPLANT
DRAIN PENROSE 1/4X12 LTX (DRAIN) ×3 IMPLANT
DRAPE LAPAROTOMY 100X77 ABD (DRAPES) ×3 IMPLANT
ELECT REM PT RETURN 9FT ADLT (ELECTROSURGICAL) ×3
ELECTRODE REM PT RTRN 9FT ADLT (ELECTROSURGICAL) ×1 IMPLANT
GLOVE BIOGEL PI IND STRL 7.0 (GLOVE) ×1 IMPLANT
GLOVE BIOGEL PI INDICATOR 7.0 (GLOVE) ×2
GLOVE SURG SYN 7.0 (GLOVE) ×6 IMPLANT
GOWN STRL REUS W/ TWL LRG LVL3 (GOWN DISPOSABLE) ×2 IMPLANT
GOWN STRL REUS W/TWL LRG LVL3 (GOWN DISPOSABLE) ×4
LABEL OR SOLS (LABEL) ×3 IMPLANT
MESH SYNTHETIC 1.8X4 KEYHOLE S (Mesh General) ×1 IMPLANT
MESH SYNTHETIC KEYHOLE S (Mesh General) ×2 IMPLANT
NEEDLE HYPO 22GX1.5 SAFETY (NEEDLE) ×6 IMPLANT
NS IRRIG 500ML POUR BTL (IV SOLUTION) ×3 IMPLANT
PACK BASIN MINOR ARMC (MISCELLANEOUS) ×3 IMPLANT
SUT ETHIBOND NAB MO 7 #0 18IN (SUTURE) ×3 IMPLANT
SUT MNCRL 4-0 (SUTURE) ×2
SUT MNCRL 4-0 27XMFL (SUTURE) ×1
SUT SILK 3 0 12 30 (SUTURE) IMPLANT
SUT SILK 3 0 SH 30 (SUTURE) IMPLANT
SUT VIC AB 2-0 CT2 27 (SUTURE) ×3 IMPLANT
SUT VIC AB 3-0 SH 27 (SUTURE) ×2
SUT VIC AB 3-0 SH 27X BRD (SUTURE) ×1 IMPLANT
SUTURE MNCRL 4-0 27XMF (SUTURE) ×1 IMPLANT
SYR 10ML LL (SYRINGE) ×3 IMPLANT
SYR 20CC LL (SYRINGE) ×3 IMPLANT
TOWEL OR 17X26 4PK STRL BLUE (TOWEL DISPOSABLE) ×3 IMPLANT
WATER STERILE IRR 1000ML POUR (IV SOLUTION) ×3 IMPLANT

## 2018-11-19 NOTE — Anesthesia Preprocedure Evaluation (Addendum)
Anesthesia Evaluation  Patient identified by MRN, date of birth, ID band Patient awake    Reviewed: Allergy & Precautions, NPO status , Patient's Chart, lab work & pertinent test results, reviewed documented beta blocker date and time   Airway Mallampati: III  TM Distance: >3 FB     Dental  (+) Chipped, Upper Dentures, Partial Lower   Pulmonary           Cardiovascular      Neuro/Psych  Neuromuscular disease    GI/Hepatic GERD  Controlled,  Endo/Other    Renal/GU      Musculoskeletal  (+) Arthritis ,   Abdominal   Peds  Hematology   Anesthesia Other Findings EKG ok. Obese.  Reproductive/Obstetrics                            Anesthesia Physical Anesthesia Plan  ASA: III  Anesthesia Plan: General   Post-op Pain Management:    Induction: Intravenous  PONV Risk Score and Plan:   Airway Management Planned: LMA  Additional Equipment:   Intra-op Plan:   Post-operative Plan:   Informed Consent: I have reviewed the patients History and Physical, chart, labs and discussed the procedure including the risks, benefits and alternatives for the proposed anesthesia with the patient or authorized representative who has indicated his/her understanding and acceptance.       Plan Discussed with: CRNA  Anesthesia Plan Comments:         Anesthesia Quick Evaluation

## 2018-11-19 NOTE — Anesthesia Procedure Notes (Signed)
Procedure Name: LMA Insertion Date/Time: 11/19/2018 1:37 PM Performed by: Justus Memory, CRNA Pre-anesthesia Checklist: Patient identified, Patient being monitored, Timeout performed, Emergency Drugs available and Suction available Patient Re-evaluated:Patient Re-evaluated prior to induction Oxygen Delivery Method: Circle system utilized Preoxygenation: Pre-oxygenation with 100% oxygen Induction Type: IV induction Ventilation: Mask ventilation without difficulty LMA: LMA inserted LMA Size: 3.5 Tube type: Oral Number of attempts: 1 Placement Confirmation: positive ETCO2 and breath sounds checked- equal and bilateral Tube secured with: Tape Dental Injury: Teeth and Oropharynx as per pre-operative assessment

## 2018-11-19 NOTE — Interval H&P Note (Signed)
History and Physical Interval Note:  11/19/2018 1:09 PM  Rebecca Salinas  has presented today for surgery, with the diagnosis of UNILATERAL RECURRENT INGUINAL HERNIA WITHOUT OBSTRUCTION OR GANGRENE  The various methods of treatment have been discussed with the patient and family. After consideration of risks, benefits and other options for treatment, the patient has consented to  Procedure(s): HERNIA REPAIR INGUINAL ADULT (Left) as a surgical intervention .  The patient's history has been reviewed, patient examined, no change in status, stable for surgery.  I have reviewed the patient's chart and labs.  Questions were answered to the patient's satisfaction.     Elie Leppo Lysle Pearl

## 2018-11-19 NOTE — Discharge Instructions (Signed)

## 2018-11-19 NOTE — Transfer of Care (Signed)
Immediate Anesthesia Transfer of Care Note  Patient: Rebecca Salinas  Procedure(s) Performed: HERNIA REPAIR INGUINAL ADULT (Left )  Patient Location: PACU  Anesthesia Type:General  Level of Consciousness: sedated  Airway & Oxygen Therapy: Patient Spontanous Breathing and Patient connected to face mask oxygen  Post-op Assessment: Report given to RN  Post vital signs: Reviewed  Last Vitals:  Vitals Value Taken Time  BP 113/70 11/19/2018  2:44 PM  Temp    Pulse 66 11/19/2018  2:49 PM  Resp 18 11/19/2018  2:49 PM  SpO2 100 % 11/19/2018  2:49 PM  Vitals shown include unvalidated device data.  Last Pain:  Vitals:   11/19/18 0812  TempSrc: Oral  PainSc: 0-No pain         Complications: No apparent anesthesia complications

## 2018-11-19 NOTE — Op Note (Signed)
Preoperative diagnosis: left recurrent Inguinal Hernia.  Postoperative diagnosis: left recurrent   Inguinal Hernia  Procedure:  Open left recurrent  Inguinal hernia repair with mesh  Anesthesia: General, LMA  Surgeon: Dr. Lysle Pearl  Wound Classification: Clean  Specimen: none  Complications: None  Estimated Blood Loss: 69mL   Indications:  Patient is a 73 y.o. female developed a symptomatic left recurrent  inguinal hernia. Repair was indicated to avoid complications of incarceration, obstruction and pain, and a prosthetic mesh repair was elected.  Findings: 1. Small direct hernia 2. Bard shaped mesh used for repair 3. Adequate hemostasis achieved  Description of procedure: The patient was taken to the operating room. A time-out was completed verifying correct patient, procedure, site, positioning, and implant(s) and/or special equipment prior to beginning this procedure. The left groin was prepped and draped in the usual sterile fashion. An incision was marked in a natural skin crease and planned to end near the pubic tubercle.  The skin crease incision was made with a knife and deepened through Scarpa's and Camper's fascia with electrocautery until the aponeurosis of the external oblique was encountered. This was cleaned and the external ring was exposed. Hemostasis was achieved in the wound. An incision was made in the midportion of the external oblique aponeurosis in the direction of its fibers. The ilioinguinal nerve was identified and protected throughout the dissection. Flaps of the external oblique were developed cephalad and inferiorly.  A direct hernia sac was identified. The sac was carefully dissected free of the surrounding tissue down to the level of the defect and excess tissue ligated.  The defect was closed primarily with running 0 ethibond suture.  Bard shaped mesh was inserted and secured to the pubic tubercle using interrupted 0 ethibond sutures. Care was taken to assure  that the mesh was placed flat against the floor and secured around to the tissue with 0 ethibond to lay flat against the newly created floor. Hemostasis was again checked. Exparel infused as an ilioinguinal block.  The external oblique aponeurosis was closed with a running suture of 2-0 Vicryl, taking care not to catch the ilioinguinal nerve in the suture line. Scarpa's fascia was closed with interrupted 3-0 Vicryl.  The deep dermal layer closed with interrupted 3-0 Vicryl.  The skin was closed with a subcuticular stitch of Monocryl 4-0. Dermabond was applied.    The patient tolerated the procedure well and was taken to the postanesthesia care unit in stable condition. Sponge and instrument count correct at end of procedure.

## 2018-11-19 NOTE — Anesthesia Post-op Follow-up Note (Signed)
Anesthesia QCDR form completed.        

## 2018-11-20 ENCOUNTER — Encounter: Payer: Self-pay | Admitting: Surgery

## 2018-11-21 NOTE — Anesthesia Postprocedure Evaluation (Signed)
Anesthesia Post Note  Patient: Verl Blalock  Procedure(s) Performed: HERNIA REPAIR INGUINAL ADULT (Left )  Patient location during evaluation: PACU Anesthesia Type: General Level of consciousness: awake and alert Pain management: pain level controlled Vital Signs Assessment: post-procedure vital signs reviewed and stable Respiratory status: spontaneous breathing, nonlabored ventilation, respiratory function stable and patient connected to nasal cannula oxygen Cardiovascular status: blood pressure returned to baseline and stable Postop Assessment: no apparent nausea or vomiting Anesthetic complications: no     Last Vitals:  Vitals:   11/19/18 1525 11/19/18 1556  BP: 139/77 (!) 155/79  Pulse: 67 71  Resp: 16 16  Temp: (!) 36.3 C   SpO2: 96% 98%    Last Pain:  Vitals:   11/20/18 0850  TempSrc:   PainSc: 0-No pain                 Martha Clan

## 2019-09-01 ENCOUNTER — Other Ambulatory Visit: Payer: Self-pay | Admitting: Obstetrics and Gynecology

## 2019-09-01 DIAGNOSIS — Z1231 Encounter for screening mammogram for malignant neoplasm of breast: Secondary | ICD-10-CM

## 2019-10-07 ENCOUNTER — Ambulatory Visit
Admission: RE | Admit: 2019-10-07 | Discharge: 2019-10-07 | Disposition: A | Discharge status: Home / Self Care | Payer: Medicare Other | Source: Ambulatory Visit | Attending: Obstetrics and Gynecology | Admitting: Obstetrics and Gynecology

## 2019-10-07 DIAGNOSIS — Z1231 Encounter for screening mammogram for malignant neoplasm of breast: Secondary | ICD-10-CM | POA: Diagnosis not present

## 2019-10-29 DIAGNOSIS — K439 Ventral hernia without obstruction or gangrene: Secondary | ICD-10-CM

## 2019-10-29 HISTORY — DX: Ventral hernia without obstruction or gangrene: K43.9

## 2019-12-15 DIAGNOSIS — H40053 Ocular hypertension, bilateral: Secondary | ICD-10-CM | POA: Diagnosis not present

## 2020-01-03 DIAGNOSIS — H40053 Ocular hypertension, bilateral: Secondary | ICD-10-CM | POA: Diagnosis not present

## 2020-03-07 ENCOUNTER — Ambulatory Visit: Payer: Medicare Other | Attending: Internal Medicine

## 2020-03-07 DIAGNOSIS — Z23 Encounter for immunization: Secondary | ICD-10-CM

## 2020-03-07 NOTE — Progress Notes (Signed)
   Covid-19 Vaccination Clinic  Name:  Rebecca Salinas    MRN: [id number] DOB: 05/23/1946  03/07/2020  [name] was observed post Covid-19 immunization for 15 minutes without incident. She was provided with Vaccine Information Sheet and instruction to access the V-Safe system.   [name] was instructed to call 911 with any severe reactions post vaccine: Marland Kitchen Difficulty breathing  . Swelling of face and throat  . A fast heartbeat  . A bad rash all over body  . Dizziness and weakness   Immunizations Administered    Name Date Dose VIS Date Route   Pfizer COVID-19 Vaccine 03/07/2020  2:04 PM 0.3 mL 12/22/2018 Intramuscular   Manufacturer: [place]   Lot: [id number]   [place]: [id number]

## 2020-03-28 ENCOUNTER — Ambulatory Visit: Payer: Medicare Other | Attending: Internal Medicine

## 2020-03-28 ENCOUNTER — Other Ambulatory Visit: Payer: Self-pay | Admitting: Obstetrics and Gynecology

## 2020-03-28 DIAGNOSIS — Z23 Encounter for immunization: Secondary | ICD-10-CM

## 2020-03-28 DIAGNOSIS — Z1231 Encounter for screening mammogram for malignant neoplasm of breast: Secondary | ICD-10-CM

## 2020-03-28 DIAGNOSIS — K439 Ventral hernia without obstruction or gangrene: Secondary | ICD-10-CM | POA: Diagnosis not present

## 2020-03-28 DIAGNOSIS — Z124 Encounter for screening for malignant neoplasm of cervix: Secondary | ICD-10-CM | POA: Diagnosis not present

## 2020-03-28 NOTE — Progress Notes (Signed)
   Covid-19 Vaccination Clinic  Name:  Rebecca Salinas    MRN: [id number] DOB: Jun 17, 1946  03/28/2020  Rebecca Salinas was observed post Covid-19 immunization for 15 minutes without incident. She was provided with Vaccine Information Sheet and instruction to access the V-Safe system.   Rebecca Salinas was instructed to call 911 with any severe reactions post vaccine: Marland Kitchen Difficulty breathing  . Swelling of face and throat  . A fast heartbeat  . A bad rash all over body  . Dizziness and weakness   Immunizations Administered    Name Date Dose VIS Date Route   Pfizer COVID-19 Vaccine 03/28/2020  1:25 PM 0.3 mL 12/22/2018 Intramuscular   Manufacturer: [place]   Lot: [id number]   [place]: [id number]

## 2020-06-21 DIAGNOSIS — Z1211 Encounter for screening for malignant neoplasm of colon: Secondary | ICD-10-CM | POA: Diagnosis not present

## 2020-07-06 DIAGNOSIS — H2513 Age-related nuclear cataract, bilateral: Secondary | ICD-10-CM | POA: Diagnosis not present

## 2020-07-06 DIAGNOSIS — H40053 Ocular hypertension, bilateral: Secondary | ICD-10-CM | POA: Diagnosis not present

## 2020-07-29 ENCOUNTER — Emergency Department: Payer: Medicare Other

## 2020-07-29 ENCOUNTER — Inpatient Hospital Stay
Admission: EM | Admit: 2020-07-29 | Discharge: 2020-08-02 | DRG: 390 | Disposition: A | Discharge status: Home / Self Care | Payer: Medicare Other | Attending: Internal Medicine | Admitting: Internal Medicine

## 2020-07-29 ENCOUNTER — Encounter: Payer: Self-pay | Admitting: Emergency Medicine

## 2020-07-29 ENCOUNTER — Other Ambulatory Visit: Payer: Self-pay

## 2020-07-29 DIAGNOSIS — I10 Essential (primary) hypertension: Secondary | ICD-10-CM | POA: Diagnosis present

## 2020-07-29 DIAGNOSIS — K219 Gastro-esophageal reflux disease without esophagitis: Secondary | ICD-10-CM | POA: Diagnosis not present

## 2020-07-29 DIAGNOSIS — M810 Age-related osteoporosis without current pathological fracture: Secondary | ICD-10-CM | POA: Diagnosis present

## 2020-07-29 DIAGNOSIS — Z9109 Other allergy status, other than to drugs and biological substances: Secondary | ICD-10-CM

## 2020-07-29 DIAGNOSIS — R9431 Abnormal electrocardiogram [ECG] [EKG]: Secondary | ICD-10-CM | POA: Diagnosis not present

## 2020-07-29 DIAGNOSIS — K56600 Partial intestinal obstruction, unspecified as to cause: Principal | ICD-10-CM | POA: Diagnosis present

## 2020-07-29 DIAGNOSIS — K56609 Unspecified intestinal obstruction, unspecified as to partial versus complete obstruction: Secondary | ICD-10-CM | POA: Diagnosis not present

## 2020-07-29 DIAGNOSIS — R112 Nausea with vomiting, unspecified: Secondary | ICD-10-CM

## 2020-07-29 DIAGNOSIS — Z90710 Acquired absence of both cervix and uterus: Secondary | ICD-10-CM

## 2020-07-29 DIAGNOSIS — Z86010 Personal history of colonic polyps: Secondary | ICD-10-CM

## 2020-07-29 DIAGNOSIS — Z20822 Contact with and (suspected) exposure to covid-19: Secondary | ICD-10-CM | POA: Diagnosis present

## 2020-07-29 DIAGNOSIS — R109 Unspecified abdominal pain: Secondary | ICD-10-CM | POA: Diagnosis not present

## 2020-07-29 LAB — CBC
HCT: 41.4 % (ref 36.0–46.0)
HCT: 37.3 % (ref 36.0–46.0)
Hemoglobin: 13.5 g/dL (ref 12.0–15.0)
Hemoglobin: 12.8 g/dL (ref 12.0–15.0)
MCH: 28.4 pg (ref 26.0–34.0)
MCH: 28.9 pg (ref 26.0–34.0)
MCHC: 32.6 g/dL (ref 30.0–36.0)
MCHC: 34.3 g/dL (ref 30.0–36.0)
MCV: 87 fL (ref 80.0–100.0)
MCV: 84.2 fL (ref 80.0–100.0)
Platelets: 278 10*3/uL (ref 150–400)
Platelets: 251 10*3/uL (ref 150–400)
RBC: 4.76 MIL/uL (ref 3.87–5.11)
RBC: 4.43 MIL/uL (ref 3.87–5.11)
RDW: 12.6 % (ref 11.5–15.5)
RDW: 12.7 % (ref 11.5–15.5)
WBC: 7.5 10*3/uL (ref 4.0–10.5)
WBC: 7.8 10*3/uL (ref 4.0–10.5)
nRBC: 0 % (ref 0.0–0.2)
nRBC: 0 % (ref 0.0–0.2)

## 2020-07-29 LAB — COMPREHENSIVE METABOLIC PANEL
ALT: 14 U/L (ref 0–44)
AST: 20 U/L (ref 15–41)
Albumin: 4.2 g/dL (ref 3.5–5.0)
Alkaline Phosphatase: 105 U/L (ref 38–126)
Anion gap: 11 (ref 5–15)
BUN: 17 mg/dL (ref 8–23)
CO2: 25 mmol/L (ref 22–32)
Calcium: 10 mg/dL (ref 8.9–10.3)
Chloride: 99 mmol/L (ref 98–111)
Creatinine, Ser: 0.81 mg/dL (ref 0.44–1.00)
GFR calc Af Amer: >60 mL/min (ref >60)
GFR calc non Af Amer: >60 mL/min (ref >60)
Glucose, Bld: 147 mg/dL — ABNORMAL HIGH (ref 70–99)
Potassium: 3.6 mmol/L (ref 3.5–5.1)
Sodium: 135 mmol/L (ref 135–145)
Total Bilirubin: 1.3 mg/dL — ABNORMAL HIGH (ref 0.3–1.2)
Total Protein: 7.8 g/dL (ref 6.5–8.1)

## 2020-07-29 LAB — URINALYSIS, COMPLETE (UACMP) WITH MICROSCOPIC
Bacteria, UA: NONE SEEN
Bilirubin Urine: NEGATIVE
Glucose, UA: NEGATIVE mg/dL
Hgb urine dipstick: NEGATIVE
Ketones, ur: 5 mg/dL — AB
Leukocytes,Ua: NEGATIVE
Nitrite: NEGATIVE
Protein, ur: 30 mg/dL — AB
Specific Gravity, Urine: 1.03 (ref 1.005–1.030)
pH: 5 (ref 5.0–8.0)

## 2020-07-29 LAB — LIPASE, BLOOD
Lipase: 23 U/L (ref 11–51)
Lipase: 22 U/L (ref 11–51)

## 2020-07-29 LAB — MAGNESIUM: Magnesium: 2.2 mg/dL (ref 1.7–2.4)

## 2020-07-29 LAB — RESPIRATORY PANEL BY RT PCR (FLU A&B, COVID)
Influenza A by PCR: NEGATIVE
Influenza B by PCR: NEGATIVE
SARS Coronavirus 2 by RT PCR: NEGATIVE

## 2020-07-29 LAB — T4, FREE: Free T4: 0.91 ng/dL (ref 0.61–1.12)

## 2020-07-29 LAB — LACTIC ACID, PLASMA
Lactic Acid, Venous: 1.6 mmol/L (ref 0.5–1.9)
Lactic Acid, Venous: 1.5 mmol/L (ref 0.5–1.9)

## 2020-07-29 LAB — TSH: TSH: 2.026 u[IU]/mL (ref 0.350–4.500)

## 2020-07-29 LAB — CREATININE, SERUM
Creatinine, Ser: 0.73 mg/dL (ref 0.44–1.00)
GFR calc Af Amer: >60 mL/min (ref >60)
GFR calc non Af Amer: >60 mL/min (ref >60)

## 2020-07-29 MED ORDER — IOHEXOL 300 MG/ML  SOLN
100.0000 mL | Freq: Once | INTRAMUSCULAR | Status: AC | PRN
  Administered 2020-07-29: 100 mL via INTRAVENOUS

## 2020-07-29 MED ORDER — FENTANYL CITRATE (PF) 100 MCG/2ML IJ SOLN
50.0000 ug | Freq: Once | INTRAMUSCULAR | Status: AC
  Administered 2020-07-29: 50 ug via INTRAVENOUS
  Filled 2020-07-29: qty 2

## 2020-07-29 MED ORDER — LACTATED RINGERS IV SOLN: INTRAVENOUS | Status: DC

## 2020-07-29 MED ORDER — SODIUM CHLORIDE 0.9 % IV SOLN
80.0000 mg | Freq: Once | INTRAVENOUS | Status: AC
  Administered 2020-07-29: 80 mg via INTRAVENOUS
  Filled 2020-07-29: qty 80

## 2020-07-29 MED ORDER — LACTATED RINGERS IV BOLUS
1000.0000 mL | Freq: Once | INTRAVENOUS | Status: AC
  Administered 2020-07-29: 1000 mL via INTRAVENOUS

## 2020-07-29 MED ORDER — PANTOPRAZOLE SODIUM 40 MG IV SOLR
40.0000 mg | Freq: Two times a day (BID) | INTRAVENOUS | Status: DC
  Administered 2020-07-30 – 2020-08-02 (×7): 40 mg via INTRAVENOUS
  Filled 2020-07-29 (×7): qty 40

## 2020-07-29 MED ORDER — ENOXAPARIN SODIUM 40 MG/0.4ML ~~LOC~~ SOLN
40.0000 mg | SUBCUTANEOUS | Status: DC
  Administered 2020-07-29 – 2020-08-01 (×4): 40 mg via SUBCUTANEOUS
  Filled 2020-07-29 (×4): qty 0.4

## 2020-07-29 MED ORDER — ONDANSETRON 4 MG PO TBDP
4.0000 mg | ORAL_TABLET | Freq: Once | ORAL | Status: AC | PRN
  Administered 2020-07-29: 4 mg via ORAL
  Filled 2020-07-29: qty 1

## 2020-07-29 MED ORDER — ONDANSETRON HCL 4 MG/2ML IJ SOLN
4.0000 mg | Freq: Four times a day (QID) | INTRAMUSCULAR | Status: DC | PRN
  Administered 2020-07-31: 4 mg via INTRAVENOUS
  Filled 2020-07-29: qty 2

## 2020-07-29 MED ORDER — ONDANSETRON HCL 4 MG PO TABS: 4.0000 mg | ORAL_TABLET | Freq: Four times a day (QID) | ORAL | Status: DC | PRN

## 2020-07-29 MED ORDER — HYDROMORPHONE HCL 1 MG/ML IJ SOLN
1.0000 mg | Freq: Four times a day (QID) | INTRAMUSCULAR | Status: DC | PRN
  Administered 2020-07-29 – 2020-07-30 (×2): 1 mg via INTRAVENOUS
  Filled 2020-07-29 (×2): qty 1

## 2020-07-29 MED ORDER — SODIUM CHLORIDE 0.9% FLUSH
3.0000 mL | Freq: Two times a day (BID) | INTRAVENOUS | Status: DC
  Administered 2020-07-30 – 2020-08-02 (×6): 3 mL via INTRAVENOUS

## 2020-07-29 MED ORDER — SODIUM CHLORIDE 0.9 % IV SOLN: INTRAVENOUS | Status: DC

## 2020-07-29 NOTE — ED Notes (Addendum)
Pt states coming in with abdominal pain, nausea, vomiting and diarrhea. Pt states pain 5/10 Pt is on cardiac, bp and pulse ox monitor.   As per provider, repeat lactic acid is not needed.

## 2020-07-29 NOTE — ED Notes (Signed)
Pt taken to CT via stretcher.

## 2020-07-29 NOTE — H&P (Signed)
History and Physical    Rebecca Salinas LDJ:[id number] DOB: 1945-11-10 DOA: 07/29/2020  PCP: Boykin Nearing, MD    Patient coming from: Home   Chief Complaint: Nausea vomiting and abdominal pain   HPI: Rebecca Salinas is a 74 y.o. female with medical history significant of GERD, carpal tunnel syndrome, energy of disc disease of C-spine, osteopenia, osteoporosis seen in the emergency room today for nausea vomiting abdominal pain that started on Thursday. Denies any travel and eating out any fevers chills chest pain rashes or any other symptom.  Patient states that prior to Thursday past few months she has been doing okay otherwise he does not recall feeling bad in any way.  No headaches no neurological symptoms.  Since Thursday she was not able to keep anything down.  Patient does report using Goody powders, routinely but not regularly.  Also does use Nexium. Patient lives with husband and is otherwise stable. .  ED Course: Blood pressure (!) 153/73, pulse 67, temperature 98.2 F (36.8 C), temperature source Oral, resp. rate 19, height 5\' 2"  (1.575 m), weight 72.1 kg, SpO2 98 %. In the emergency room patient is alert awake oriented and reports abdominal pain that is generalized.  Patient is given IV fluids and fentanyl for her abdominal pain.  Review of Systems: As per HPI otherwise all systems reviewed and negative.  Past Medical History:  Diagnosis Date  . Arthritis   . Carpal tunnel syndrome   . Cervical disc disease   . Colon adenomas   . Fat necrosis   . GERD (gastroesophageal reflux disease)   . Neutropenia ([place])   . Osteopenia   . Osteoporosis   . Spondylolisthesis     Past Surgical History:  Procedure Laterality Date  . ABDOMINAL HYSTERECTOMY    . BREAST EXCISIONAL BIOPSY Left 2011  . COLONOSCOPY    . COLONOSCOPY WITH PROPOFOL N/A 08/18/2017   Procedure: COLONOSCOPY WITH PROPOFOL;  Surgeon: Manya Silvas, MD;  Location[facility];  Service: Endoscopy;   Laterality: N/A;  . HERNIA REPAIR Left   . INGUINAL HERNIA REPAIR Left 11/19/2018   Procedure: HERNIA REPAIR INGUINAL ADULT;  Surgeon: Benjamine Sprague, DO;  Location: [facility] ORS;  Service: General;  Laterality: Left;  . lavh    . SHOULDER SURGERY Right      reports that she has never smoked. She has never used smokeless tobacco. She reports current alcohol use. She reports that she does not use drugs.  Allergies  Allergen Reactions  . Norflex [Orphenadrine Citrate]     Lightheaded   . Lodine [Etodolac] Itching and Rash    Family History  Problem Relation Age of Onset  . Diabetes Mother   . Lung disease Father   . Breast cancer Neg Hx     Prior to Admission medications   Medication Sig Start Date End Date Taking? Authorizing Provider  esomeprazole (NEXIUM) 40 MG capsule Take 40 mg by mouth every morning.     [provider]  vitamin C (ASCORBIC ACID) 500 MG tablet Take 500 mg by mouth as needed.     [provider]    Physical Exam: Vitals:   07/29/20 1128 07/29/20 1129 07/29/20 1752 07/29/20 1910  BP: (!) 131/57  (!) 151/79 (!) 153/73  Pulse: 86  64 67  Resp: 18  16 19   Temp: 98.2 F (36.8 C)     TempSrc: Oral     SpO2: 98%  100% 98%  Weight:  72.1 kg  Height:  5\' 2"  (1.575 m)      Constitutional: NAD, calm, comfortable Vitals:   07/29/20 1128 07/29/20 1129 07/29/20 1752 07/29/20 1910  BP: (!) 131/57  (!) 151/79 (!) 153/73  Pulse: 86  64 67  Resp: 18  16 19   Temp: 98.2 F (36.8 C)     TempSrc: Oral     SpO2: 98%  100% 98%  Weight:  72.1 kg    Height:  5\' 2"  (1.575 m)     Eyes:  EOMI lids and conjunctivae normal ENMT: Mucous membranes are moist. Posterior pharynx clear of any exudate or lesions.Normal dentition.  Neck: normal, supple, no masses, no thyromegaly, no carotid bruit  Respiratory: clear to auscultation bilaterally, no wheezing, no crackles. Normal respiratory effort. No accessory muscle use.  Cardiovascular: Regular rate and rhythm,  no murmurs / rubs / gallops. No extremity edema. 2+ pedal pulses. No carotid bruits.  Abdomen: no tenderness, no masses palpated.  Bowel sounds positive.  Musculoskeletal: no clubbing / cyanosis. No joint deformity upper and lower extremities. Pt moving all four ext , no contractures. Normal muscle tone.  Skin: no rashes, lesions, ulcers. No induration Neurologic: CN 2-12 grossly intact.  Strength 5/5 in all 4.  Psychiatric: Normal judgment and insight. Alert and oriented x 3. Normal mood.   Labs on Admission: I have personally reviewed following labs and imaging studies  CBC: Recent Labs  Lab 07/29/20 1136  WBC 7.5  HGB 13.5  HCT 41.4  MCV 87.0  PLT 932   Basic Metabolic Panel: Recent Labs  Lab 07/29/20 1136 07/29/20 1708  NA 135  --   K 3.6  --   CL 99  --   CO2 25  --   GLUCOSE 147*  --   BUN 17  --   CREATININE 0.81  --   CALCIUM 10.0  --   MG  --  2.2   GFR: Estimated Creatinine Clearance: 56.7 mL/min (by C-G formula based on SCr of 0.81 mg/dL). Liver Function Tests: Recent Labs  Lab 07/29/20 1136  AST 20  ALT 14  ALKPHOS 105  BILITOT 1.3*  PROT 7.8  ALBUMIN 4.2   Recent Labs  Lab 07/29/20 1136  LIPASE 23   No results for input(s): AMMONIA in the last 168 hours. Coagulation Profile: No results for input(s): INR, PROTIME in the last 168 hours. Cardiac Enzymes: No results for input(s): CKTOTAL, CKMB, CKMBINDEX, TROPONINI in the last 168 hours. BNP (last 3 results) No results for input(s): PROBNP in the last 8760 hours. HbA1C: No results for input(s): HGBA1C in the last 72 hours. CBG: No results for input(s): GLUCAP in the last 168 hours. Lipid Profile: No results for input(s): CHOL, HDL, LDLCALC, TRIG, CHOLHDL, LDLDIRECT in the last 72 hours. Thyroid Function Tests: No results for input(s): TSH, T4TOTAL, FREET4, T3FREE, THYROIDAB in the last 72 hours. Anemia Panel: No results for input(s): VITAMINB12, FOLATE, FERRITIN, TIBC, IRON, RETICCTPCT in  the last 72 hours. Urine analysis:    Component Value Date/Time   COLORURINE AMBER (A) 07/29/2020 1136   APPEARANCEUR HAZY (A) 07/29/2020 1136   APPEARANCEUR Cloudy 01/04/2013 1854   LABSPEC 1.030 07/29/2020 1136   LABSPEC 1.014 01/04/2013 1854   PHURINE 5.0 07/29/2020 1136   GLUCOSEU NEGATIVE 07/29/2020 1136   GLUCOSEU Negative 01/04/2013 1854   HGBUR NEGATIVE 07/29/2020 1136   BILIRUBINUR NEGATIVE 07/29/2020 1136   BILIRUBINUR Negative 01/04/2013 1854   KETONESUR 5 (A) 07/29/2020 1136   PROTEINUR 30 (A) 07/29/2020  [place] 07/29/2020 [place] 07/29/2020 1136   LEUKOCYTESUR 3+ 01/04/2013 1854    Intake/Output Summary (Last 24 hours) at 07/29/2020 2042 Last data filed at 07/29/2020 2008 Gross per 24 hour  Intake 1000 ml  Output --  Net 1000 ml   Lab Results  Component Value Date   CREATININE 0.81 07/29/2020   CREATININE 0.83 01/04/2013    COVID-19 Labs  No results for input(s): DDIMER, FERRITIN, LDH, CRP in the last 72 hours.  Lab Results  Component Value Date   [place] NEGATIVE 07/29/2020    Radiological Exams on Admission: CT ABDOMEN PELVIS W CONTRAST  Result Date: 07/29/2020 CLINICAL DATA:  Abdominal pain EXAM: CT ABDOMEN AND PELVIS WITH CONTRAST TECHNIQUE: Multidetector CT imaging of the abdomen and pelvis was performed using the standard protocol following bolus administration of intravenous contrast. CONTRAST:  123mL OMNIPAQUE IOHEXOL 300 MG/ML  SOLN COMPARISON:  July 21, 2018 FINDINGS: Lower chest: The visualized heart size within normal limits. No pericardial fluid/thickening. There is a small hiatal hernia present The visualized portions of the lungs are clear. Hepatobiliary: The liver is normal in density without focal abnormality.The main portal vein is patent. No evidence of calcified gallstones, gallbladder wall thickening or biliary dilatation. Pancreas: Unremarkable. No pancreatic ductal dilatation or surrounding  inflammatory changes. Spleen: Normal in size without focal abnormality. Adrenals/Urinary Tract: Both adrenal glands appear normal. The kidneys and collecting system appear normal without evidence of urinary tract calculus or hydronephrosis. Bladder is unremarkable. Stomach/Bowel: The stomach and proximal small bowel are normal in appearance. Within the mid ileal loops there is mildly dilated loops filled with fluid and air measuring up to 3.3 cm. There is also fecalization seen within the mid ileal loops with a focal area of narrowing seen distally best seen on the sagittal views, series 6, image 67. The remainder of the distal ileal loops appear to be decompressed. There is a moderate amount of colonic stool present throughout. The appendix is unremarkable. Vascular/Lymphatic: There are no enlarged mesenteric, retroperitoneal, or pelvic lymph nodes. Scattered aortic atherosclerotic calcifications are seen without aneurysmal dilatation. Reproductive: The patient is status post hysterectomy. No adnexal masses or collections seen. Other: No evidence of abdominal wall mass or hernia. Musculoskeletal: No acute or significant osseous findings. IMPRESSION: Findings suggestive of partial small bowel obstruction with a focal area of narrowing within the distal ileal loops as described above. Aortic Atherosclerosis (ICD10-I70.0). Electronically Signed   By: Prudencio Pair M.D.   On: 07/29/2020 19:28    EKG: Independently reviewed.  Sinus rhythm 65.   Assessment/Plan Active Problems:   Gastroesophageal reflux disease without esophagitis   Nausea & vomiting   Small bowel obstruction, partial (HCC)   -IVF/IV PPI, prn Zofran. -npo/ lipase and ggt. -d/d include SBO due to adhesion or PUD and or related adhesion due to pt's use  Of goody powders. -BGT/Gen surg consult if pt's condition worsens.   DVT prophylaxis:  Lovenox Code Status:  Full Code  Family Communication:  None at bedside.   Disposition Plan:   Home   Consults called:  None   Admission status: Observation  Para Skeans MD [facility] Pager [phone] If 7PM-7AM, please contact night-coverage [email or web address] Password [facility] 07/29/2020, 8:42 PM

## 2020-07-29 NOTE — ED Notes (Signed)
Pt states stabbing abdo pain started 9/30. Has not eaten since then except 2 sips soup broth yesterday. Has vomited about 7 times since 9/30. Abdo nontender to light palpation. Bowel sounds present all quadrants. States pain is constant.

## 2020-07-29 NOTE — ED Provider Notes (Signed)
[facility] Emergency Department Provider Note  ____________________________________________   First MD Initiated Contact with Patient 07/29/20 1703     (approximate)  I have reviewed the triage vital signs and the nursing notes.   HISTORY  Chief Complaint Abdominal Pain   HPI Rebecca Salinas is a 74 y.o. female with past medical history arthritis, carpal tunnel syndrome, GERD, osteoporosis, and previous known abdominal inguinal hernia that was repaired with a mesh several years ago who presents for assessment approximately 2 to 3 days of generalized abdominal pain worse in left lower quadrant associate with nonbloody nonbilious emesis and nonbloody diarrhea.  Patient states he has not been able to keep anything down has poor appetite.  She denies any fevers, chills, cough, chest pain, back pain, headache [place], sore throat, burning with urination, blood in her urine, urinary frequency, back pain, flank pain, rash, extremity pain, other acute complaints.  No prior similar episodes.  No clear alleviating factors.  Aggravating factors include palpation palpation of her abdomen.  She denies EtOH or illicit drug use or significant NSAID use.         Past Medical History:  Diagnosis Date  . Arthritis   . Carpal tunnel syndrome   . Cervical disc disease   . Colon adenomas   . Fat necrosis   . GERD (gastroesophageal reflux disease)   . Neutropenia ([place])   . Osteopenia   . Osteoporosis   . Spondylolisthesis     Patient Active Problem List   Diagnosis Date Noted  . Gastroesophageal reflux disease without esophagitis 08/20/2018  . Hx of adenomatous colonic polyps 05/23/2017    Past Surgical History:  Procedure Laterality Date  . ABDOMINAL HYSTERECTOMY    . BREAST EXCISIONAL BIOPSY Left 2011  . COLONOSCOPY    . COLONOSCOPY WITH PROPOFOL N/A 08/18/2017   Procedure: COLONOSCOPY WITH PROPOFOL;  Surgeon: Manya Silvas, MD;  Location: [facility] ENDOSCOPY;   Service: Endoscopy;  Laterality: N/A;  . HERNIA REPAIR Left   . INGUINAL HERNIA REPAIR Left 11/19/2018   Procedure: HERNIA REPAIR INGUINAL ADULT;  Surgeon: Benjamine Sprague, DO;  Location: [facility] ORS;  Service: General;  Laterality: Left;  . lavh    . SHOULDER SURGERY Right     Prior to Admission medications   Medication Sig Start Date End Date Taking? Authorizing Provider  esomeprazole (NEXIUM) 40 MG capsule Take 40 mg by mouth every morning.     [provider]  vitamin C (ASCORBIC ACID) 500 MG tablet Take 500 mg by mouth as needed.     [provider]    Allergies Norflex [orphenadrine citrate] and Lodine [etodolac]  Family History  Problem Relation Age of Onset  . Breast cancer Neg Hx     Social History Social History   Tobacco Use  . Smoking status: Never Smoker  . Smokeless tobacco: Never Used  Vaping Use  . Vaping Use: Never used  Substance Use Topics  . Alcohol use: Yes    Comment: rare WINE  . Drug use: No    Review of Systems  Review of Systems  Constitutional: Positive for malaise/fatigue. Negative for chills and fever.  HENT: Negative for sore throat.   Eyes: Negative for pain.  Respiratory: Negative for cough and stridor.   Cardiovascular: Negative for chest pain.  Gastrointestinal: Positive for abdominal pain, diarrhea, nausea and vomiting.  Genitourinary: Negative for dysuria.  Musculoskeletal: Negative for myalgias.  Skin: Negative for rash.  Neurological: Negative for seizures, loss of  consciousness and headaches.  Psychiatric/Behavioral: Negative for suicidal ideas.  All other systems reviewed and are negative.     ____________________________________________   PHYSICAL EXAM:  VITAL SIGNS: ED Triage Vitals  Enc Vitals Group     BP 07/29/20 1128 (!) 131/57     Pulse Rate 07/29/20 1128 86     Resp 07/29/20 1128 18     Temp 07/29/20 1128 98.2 F (36.8 C)     Temp Source 07/29/20 1128 Oral     SpO2 07/29/20 1128 98 %      Weight 07/29/20 1129 159 lb (72.1 kg)     Height 07/29/20 1129 5\' 2"  (1.575 m)     Head Circumference --      Peak Flow --      Pain Score 07/29/20 1129 9     Pain Loc --      Pain Edu? --      Excl. in [place]? --    Vitals:   07/29/20 1752 07/29/20 1910  BP: (!) 151/79 (!) 153/73  Pulse: 64 67  Resp: 16 19  Temp:    SpO2: 100% 98%   Physical Exam Vitals and nursing note reviewed.  Constitutional:      General: She is not in acute distress.    Appearance: She is well-developed.  HENT:     Head: Normocephalic and atraumatic.     Right Ear: External ear normal.     Left Ear: External ear normal.     Nose: Nose normal.     Mouth/Throat:     Mouth: Mucous membranes are dry.  Eyes:     Conjunctiva/sclera: Conjunctivae normal.  Cardiovascular:     Rate and Rhythm: Normal rate and regular rhythm.     Heart sounds: No murmur heard.   Pulmonary:     Effort: Pulmonary effort is normal. No respiratory distress.     Breath sounds: Normal breath sounds.  Abdominal:     Palpations: Abdomen is soft.     Tenderness: There is abdominal tenderness in the suprapubic area and left lower quadrant. There is no right CVA tenderness or left CVA tenderness.  Musculoskeletal:     Cervical back: Neck supple.  Skin:    General: Skin is warm and dry.     Capillary Refill: Capillary refill takes 2 to 3 seconds.  Neurological:     Mental Status: She is alert and oriented to person, place, and time.  Psychiatric:        Mood and Affect: Mood normal.      ____________________________________________   LABS (all labs ordered are listed, but only abnormal results are displayed)  Labs Reviewed  COMPREHENSIVE METABOLIC PANEL - Abnormal; Notable for the following components:      Result Value   Glucose, Bld 147 (*)    Total Bilirubin 1.3 (*)    All other components within normal limits  URINALYSIS, COMPLETE (UACMP) WITH MICROSCOPIC - Abnormal; Notable for the following components:   Color,  Urine AMBER (*)    APPearance HAZY (*)    Ketones, ur 5 (*)    Protein, ur 30 (*)    All other components within normal limits  RESPIRATORY PANEL BY RT PCR (FLU A&B, COVID)  LIPASE, BLOOD  CBC  LACTIC ACID, PLASMA  MAGNESIUM   ____________________________________________  EKG  Sinus rhythm with a ventricular rate of 65, normal axis, unremarkable intervals, no evidence of acute ischemia. ____________________________________________  RADIOLOGY   Official radiology report(s): CT ABDOMEN PELVIS W  CONTRAST  Result Date: 07/29/2020 CLINICAL DATA:  Abdominal pain EXAM: CT ABDOMEN AND PELVIS WITH CONTRAST TECHNIQUE: Multidetector CT imaging of the abdomen and pelvis was performed using the standard protocol following bolus administration of intravenous contrast. CONTRAST:  192mL OMNIPAQUE IOHEXOL 300 MG/ML  SOLN COMPARISON:  July 21, 2018 FINDINGS: Lower chest: The visualized heart size within normal limits. No pericardial fluid/thickening. There is a small hiatal hernia present The visualized portions of the lungs are clear. Hepatobiliary: The liver is normal in density without focal abnormality.The main portal vein is patent. No evidence of calcified gallstones, gallbladder wall thickening or biliary dilatation. Pancreas: Unremarkable. No pancreatic ductal dilatation or surrounding inflammatory changes. Spleen: Normal in size without focal abnormality. Adrenals/Urinary Tract: Both adrenal glands appear normal. The kidneys and collecting system appear normal without evidence of urinary tract calculus or hydronephrosis. Bladder is unremarkable. Stomach/Bowel: The stomach and proximal small bowel are normal in appearance. Within the mid ileal loops there is mildly dilated loops filled with fluid and air measuring up to 3.3 cm. There is also fecalization seen within the mid ileal loops with a focal area of narrowing seen distally best seen on the sagittal views, series 6, image 67. The remainder  of the distal ileal loops appear to be decompressed. There is a moderate amount of colonic stool present throughout. The appendix is unremarkable. Vascular/Lymphatic: There are no enlarged mesenteric, retroperitoneal, or pelvic lymph nodes. Scattered aortic atherosclerotic calcifications are seen without aneurysmal dilatation. Reproductive: The patient is status post hysterectomy. No adnexal masses or collections seen. Other: No evidence of abdominal wall mass or hernia. Musculoskeletal: No acute or significant osseous findings. IMPRESSION: Findings suggestive of partial small bowel obstruction with a focal area of narrowing within the distal ileal loops as described above. Aortic Atherosclerosis (ICD10-I70.0). Electronically Signed   By: Prudencio Pair M.D.   On: 07/29/2020 19:28    ____________________________________________   PROCEDURES  Procedure(s) performed (including Critical Care):  .1-3 Lead EKG Interpretation Performed by: Lucrezia Starch, MD Authorized by: Lucrezia Starch, MD     Interpretation: normal     ECG rate assessment: normal     Rhythm: sinus rhythm     Ectopy: none     Conduction: normal       ____________________________________________   INITIAL IMPRESSION / ASSESSMENT AND PLAN / ED COURSE        Patient presents with [place] to history exam for assessment of abdominal pain associated with nonbloody nonbilious emesis and some diarrhea the last couple of days.  Patient is slightly hypertensive otherwise stable vital signs on arrival.  Exam as above remarkable for some suprapubic and left lower quadrant tenderness.  Differential includes partial SBO, SBO, cystitis, pyelonephritis, diverticulitis, internal hernia, pancreatitis, cholecystitis.  Lipase is unremarkable not consistent with acute pancreatitis.  CMP shows glucose of 147 but otherwise unremarkable with no significant metabolic or electrolyte derangements and LFTs that are within normal limits.  CBC is  unremarkable.  UA shows some ketones and protein but no evidence of infection.  Lactic acid and Covid are unremarkable.  CT abdomen pelvis concerning for partial SBO without evidence of other acute intra-abdominal pathology.  Patient given IV analgesia and IV fluids as noted below.  I discussed patient's presentation with on-call surgery attending Dr. Lysle Pearl who did not recommend any acute surgical interventions at this time and medicine admission for hydration and observation.   ____________________________________________   FINAL CLINICAL IMPRESSION(S) / ED DIAGNOSES  Final diagnoses:  SBO (small bowel  obstruction) (HCC)    Medications  lactated ringers infusion (has no administration in time range)  fentaNYL (SUBLIMAZE) injection 50 mcg (has no administration in time range)  ondansetron (ZOFRAN-ODT) disintegrating tablet 4 mg (4 mg Oral Given 07/29/20 1134)  lactated ringers bolus 1,000 mL (0 mLs Intravenous Stopped 07/29/20 2008)  iohexol (OMNIPAQUE) 300 MG/ML solution 100 mL (100 mLs Intravenous Contrast Given 07/29/20 1852)  fentaNYL (SUBLIMAZE) injection 50 mcg (50 mcg Intravenous Given 07/29/20 1839)     ED Discharge Orders    None       Note:  This document was prepared using Dragon voice recognition software and may include unintentional dictation errors.   Lucrezia Starch, MD 07/29/20 2017

## 2020-07-29 NOTE — ED Triage Notes (Signed)
Generalized abdominal cramping for two days with NVD. Reports unable to keep anything down. VSS. No fever.  Unlabored. Color WNL.

## 2020-07-30 DIAGNOSIS — Z9109 Other allergy status, other than to drugs and biological substances: Secondary | ICD-10-CM | POA: Diagnosis not present

## 2020-07-30 DIAGNOSIS — Z86010 Personal history of colonic polyps: Secondary | ICD-10-CM | POA: Diagnosis not present

## 2020-07-30 DIAGNOSIS — K219 Gastro-esophageal reflux disease without esophagitis: Secondary | ICD-10-CM | POA: Diagnosis present

## 2020-07-30 DIAGNOSIS — K56609 Unspecified intestinal obstruction, unspecified as to partial versus complete obstruction: Secondary | ICD-10-CM | POA: Diagnosis not present

## 2020-07-30 DIAGNOSIS — K56600 Partial intestinal obstruction, unspecified as to cause: Secondary | ICD-10-CM | POA: Diagnosis present

## 2020-07-30 DIAGNOSIS — I10 Essential (primary) hypertension: Secondary | ICD-10-CM | POA: Diagnosis present

## 2020-07-30 DIAGNOSIS — K6389 Other specified diseases of intestine: Secondary | ICD-10-CM | POA: Diagnosis not present

## 2020-07-30 DIAGNOSIS — Z90710 Acquired absence of both cervix and uterus: Secondary | ICD-10-CM | POA: Diagnosis not present

## 2020-07-30 DIAGNOSIS — Z20822 Contact with and (suspected) exposure to covid-19: Secondary | ICD-10-CM | POA: Diagnosis present

## 2020-07-30 DIAGNOSIS — R109 Unspecified abdominal pain: Secondary | ICD-10-CM | POA: Diagnosis not present

## 2020-07-30 DIAGNOSIS — M810 Age-related osteoporosis without current pathological fracture: Secondary | ICD-10-CM | POA: Diagnosis present

## 2020-07-30 LAB — CBC WITH DIFFERENTIAL/PLATELET
Abs Immature Granulocytes: 0.02 10*3/uL (ref 0.00–0.07)
Basophils Absolute: 0 10*3/uL (ref 0.0–0.1)
Basophils Relative: 0 %
Eosinophils Absolute: 0 10*3/uL (ref 0.0–0.5)
Eosinophils Relative: 1 %
HCT: 35.8 % — ABNORMAL LOW (ref 36.0–46.0)
Hemoglobin: 12 g/dL (ref 12.0–15.0)
Immature Granulocytes: 0 %
Lymphocytes Relative: 22 %
Lymphs Abs: 1.3 10*3/uL (ref 0.7–4.0)
MCH: 28.7 pg (ref 26.0–34.0)
MCHC: 33.5 g/dL (ref 30.0–36.0)
MCV: 85.6 fL (ref 80.0–100.0)
Monocytes Absolute: 0.5 10*3/uL (ref 0.1–1.0)
Monocytes Relative: 9 %
Neutro Abs: 4.2 10*3/uL (ref 1.7–7.7)
Neutrophils Relative %: 68 %
Platelets: 217 10*3/uL (ref 150–400)
RBC: 4.18 MIL/uL (ref 3.87–5.11)
RDW: 12.8 % (ref 11.5–15.5)
WBC: 6.1 10*3/uL (ref 4.0–10.5)
nRBC: 0 % (ref 0.0–0.2)

## 2020-07-30 LAB — COMPREHENSIVE METABOLIC PANEL
ALT: 12 U/L (ref 0–44)
AST: 23 U/L (ref 15–41)
Albumin: 3.3 g/dL — ABNORMAL LOW (ref 3.5–5.0)
Alkaline Phosphatase: 77 U/L (ref 38–126)
Anion gap: 6 (ref 5–15)
BUN: 17 mg/dL (ref 8–23)
CO2: 27 mmol/L (ref 22–32)
Calcium: 8.5 mg/dL — ABNORMAL LOW (ref 8.9–10.3)
Chloride: 106 mmol/L (ref 98–111)
Creatinine, Ser: 0.68 mg/dL (ref 0.44–1.00)
GFR calc Af Amer: >60 mL/min (ref >60)
GFR calc non Af Amer: >60 mL/min (ref >60)
Glucose, Bld: 115 mg/dL — ABNORMAL HIGH (ref 70–99)
Potassium: 3.6 mmol/L (ref 3.5–5.1)
Sodium: 139 mmol/L (ref 135–145)
Total Bilirubin: 1.2 mg/dL (ref 0.3–1.2)
Total Protein: 6.4 g/dL — ABNORMAL LOW (ref 6.5–8.1)

## 2020-07-30 LAB — CK: Total CK: 198 U/L (ref 38–234)

## 2020-07-30 LAB — GAMMA GT: GGT: 15 U/L (ref 7–50)

## 2020-07-30 MED ORDER — MELATONIN 5 MG PO TABS
2.5000 mg | ORAL_TABLET | Freq: Every day | ORAL | Status: DC
  Administered 2020-07-30 – 2020-08-01 (×3): 2.5 mg via ORAL
  Filled 2020-07-30: qty 0.5
  Filled 2020-07-30: qty 1
  Filled 2020-07-30: qty 0.5
  Filled 2020-07-30: qty 1

## 2020-07-30 MED ORDER — HYDRALAZINE HCL 20 MG/ML IJ SOLN: 10.0000 mg | INTRAMUSCULAR | Status: DC | PRN

## 2020-07-30 MED ORDER — LACTATED RINGERS IV SOLN: INTRAVENOUS | Status: DC

## 2020-07-30 MED ORDER — KETOROLAC TROMETHAMINE 30 MG/ML IJ SOLN: 30.0000 mg | Freq: Once | INTRAMUSCULAR | Status: DC

## 2020-07-30 NOTE — ED Notes (Signed)
Patient is resting comfortably. Breathing is even and unlabored. Will continue to monitor.

## 2020-07-30 NOTE — Progress Notes (Signed)
PROGRESS NOTE    Patient: Rebecca Salinas                            PCP: Boykin Nearing, MD                    DOB: 04-08-1946            DOA: 07/29/2020 LOV:[id number]             DOS: 07/30/2020, 7:54 AM   LOS: 0 days   Date of Service: The patient was seen and examined on 07/30/2020  Subjective:   The patient was seen and examined this Am. Stable, NPO  Still complaining of :  Otherwise no issues overnight .  Brief Narrative:   Rebecca Salinas is a 74 y.o. female with medical history significant of GERD, carpal tunnel syndrome, energy of disc disease of C-spine, osteopenia, osteoporosis seen in the emergency room today for nausea vomiting abdominal pain that started on Thursday.  ED: Vitals were stable, with exception of elevated blood pressure as high as 189/91  labs within normal limits, lactic acid 1.6, 1.5 CT of abdomen pelvis: IMPRESSION: Findings suggestive of partial small bowel obstruction with a focal area of narrowing within the distal ileal loops as described above.  Patient subsequently admitted for partial small bowel obstruction..    Assessment & Plan:   Principal Problem:   Small bowel obstruction, partial (HCC) Active Problems:   Nausea & vomiting   Gastroesophageal reflux disease without esophagitis    Principal Problem:   Small bowel obstruction, partial (HCC) -Patient will remain n.p.o. -Anticipating nasogastric tube placement if continued nausea vomiting -Continue IV fluid hydration -Monitoring electrolytes -Supportive therapy -If no improvement concerning consulting general surgery  Intractable nausea vomiting -Due to partial small bowel obstruction -We will continue as needed antiemetics -IV fluid resuscitation -If no improvement considering inserting NG tube  History of GERD without esophagitis -We will continue IV PPI -Monitoring closely  History of osteoporosis, osteopenia -Follow the PCP as an outpatient currently  n.p.o. -Medication be considered including vitamin D supplements as an outpatient   Accelerated hypertension -Blood pressure was transiently elevated -We will continue as needed hydralazine -We will monitor BP closely, will consider initiating oral medication upon discharge if needed     Nutritional status:          Cultures; None   Antimicrobials: None     Consultants: None   ------------------------------------------------------------------------------------------------------------------------------------------------  DVT prophylaxis:  SCD/Compression stockings and Lovenox SQ Code Status:   Code Status: Full Code Family Communication: No family member present at bedside- attempt will be made to update daily The above findings and plan of care has been discussed with patient (and family )  in detail,  they expressed understanding and agreement of above. -Advance care planning has been discussed.   Admission status:    Status is: Observation  The patient remains OBS appropriate and will d/c before 2 midnights.  Dispo: The patient is from: Home              Anticipated d/c is to: Home              Anticipated d/c date is: 2 days              Patient currently is not medically stable to d/c.        Procedures:   No admission procedures for hospital encounter.  Antimicrobials:  Anti-infectives (From admission, onward)   None       Medication:  . enoxaparin (LOVENOX) injection  40 mg Subcutaneous Q24H  . pantoprazole (PROTONIX) IV  40 mg Intravenous Q12H  . sodium chloride flush  3 mL Intravenous Q12H    HYDROmorphone (DILAUDID) injection, ondansetron **OR** ondansetron (ZOFRAN) IV   Objective:   Vitals:   07/29/20 1752 07/29/20 1910 07/29/20 2148 07/30/20 0411  BP: (!) 151/79 (!) 153/73 (!) 189/91 122/83  Pulse: 64 67 61 61  Resp: 16 19 19 18   Temp:    98.5 F (36.9 C)  TempSrc:    Oral  SpO2: 100% 98% 98% 98%  Weight:       Height:        Intake/Output Summary (Last 24 hours) at 07/30/2020 0754 Last data filed at 07/29/2020 2008 Gross per 24 hour  Intake 1000 ml  Output --  Net 1000 ml   Filed Weights   07/29/20 1129  Weight: 72.1 kg     Examination:   Physical Exam  Constitution:  Alert, cooperative, no distress,  Appears calm and comfortable  Psychiatric: Normal and stable mood and affect, cognition intact,   HEENT: Normocephalic, PERRL, otherwise with in Normal limits  Chest:Chest symmetric Cardio vascular:  S1/S2, RRR, No murmure, No Rubs or Gallops  pulmonary: Clear to auscultation bilaterally, respirations unlabored, negative wheezes / crackles Abdomen: Soft, non-tender, non-distended, bowel sounds,no masses, no organomegaly Muscular skeletal: Limited exam - in bed, able to move all 4 extremities, Normal strength,  Neuro: CNII-XII intact. , normal motor and sensation, reflexes intact  Extremities: No pitting edema lower extremities, +2 pulses  Skin: Dry, warm to touch, negative for any Rashes, No open wounds Wounds: per nursing documentation    ------------------------------------------------------------------------------------------------------------------------------------------    LABs:  CBC Latest Ref Rng & Units 07/30/2020 07/29/2020 07/29/2020  WBC 4.0 - 10.5 K/uL 6.1 7.8 7.5  Hemoglobin 12.0 - 15.0 g/dL 12.0 12.8 13.5  Hematocrit 36 - 46 % 35.8(L) 37.3 41.4  Platelets 150 - 400 K/uL 217 251 278   CMP Latest Ref Rng & Units 07/30/2020 07/29/2020 07/29/2020  Glucose 70 - 99 mg/dL 115(H) - 147(H)  BUN 8 - 23 mg/dL 17 - 17  Creatinine 0.44 - 1.00 mg/dL 0.68 0.73 0.81  Sodium 135 - 145 mmol/L 139 - 135  Potassium 3.5 - 5.1 mmol/L 3.6 - 3.6  Chloride 98 - 111 mmol/L 106 - 99  CO2 22 - 32 mmol/L 27 - 25  Calcium 8.9 - 10.3 mg/dL 8.5(L) - 10.0  Total Protein 6.5 - 8.1 g/dL 6.4(L) - 7.8  Total Bilirubin 0.3 - 1.2 mg/dL 1.2 - 1.3(H)  Alkaline Phos 38 - 126 U/L 77 - 105  AST 15 - 41  U/L 23 - 20  ALT 0 - 44 U/L 12 - 14       Micro Results Recent Results (from the past 240 hour(s))  Respiratory Panel by RT PCR (Flu A&B, Covid) - Nasopharyngeal Swab     Status: None   Collection Time: 07/29/20  5:49 PM   Specimen: Nasopharyngeal Swab  Result Value Ref Range Status   SARS Coronavirus 2 by RT PCR NEGATIVE NEGATIVE Final    Comment: (NOTE) SARS-CoV-2 target nucleic acids are NOT DETECTED.  The SARS-CoV-2 RNA is generally detectable in upper respiratoy specimens during the acute phase of infection. The lowest concentration of SARS-CoV-2 viral copies this assay can detect is 131 copies/mL. A negative result does not preclude SARS-Cov-2 infection  and should not be used as the sole basis for treatment or other patient management decisions. A negative result may occur with  improper specimen collection/handling, submission of specimen other than nasopharyngeal swab, presence of viral mutation(s) within the areas targeted by this assay, and inadequate number of viral copies (<131 copies/mL). A negative result must be combined with clinical observations, patient history, and epidemiological information. The expected result is Negative.  Fact Sheet for Patients:  PinkCheek.be  Fact Sheet for Healthcare Providers:  GravelBags.it  This test is no t yet approved or cleared by the [place] FDA and  has been authorized for detection and/or diagnosis of SARS-CoV-2 by FDA under an Emergency Use Authorization (EUA). This EUA will remain  in effect (meaning this test can be used) for the duration of the COVID-19 declaration under Section 564(b)(1) of the Act, 21 U.S.C. section 360bbb-3(b)(1), unless the authorization is terminated or revoked sooner.     Influenza A by PCR NEGATIVE NEGATIVE Final   Influenza B by PCR NEGATIVE NEGATIVE Final    Comment: (NOTE) The Xpert Xpress SARS-CoV-2/FLU/RSV assay is  intended as an aid in  the diagnosis of influenza from Nasopharyngeal swab specimens and  should not be used as a sole basis for treatment. Nasal washings and  aspirates are unacceptable for Xpert Xpress SARS-CoV-2/FLU/RSV  testing.  Fact Sheet for Patients: PinkCheek.be  Fact Sheet for Healthcare Providers: GravelBags.it  This test is not yet approved or cleared by the [place] FDA and  has been authorized for detection and/or diagnosis of SARS-CoV-2 by  FDA under an Emergency Use Authorization (EUA). This EUA will remain  in effect (meaning this test can be used) for the duration of the  Covid-19 declaration under Section 564(b)(1) of the Act, 21  U.S.C. section 360bbb-3(b)(1), unless the authorization is  terminated or revoked. Performed at [facility], [address], [place], [place] [address]     Radiology Reports CT ABDOMEN PELVIS W CONTRAST  Result Date: 07/29/2020 CLINICAL DATA:  Abdominal pain EXAM: CT ABDOMEN AND PELVIS WITH CONTRAST TECHNIQUE: Multidetector CT imaging of the abdomen and pelvis was performed using the standard protocol following bolus administration of intravenous contrast. CONTRAST:  117mL OMNIPAQUE IOHEXOL 300 MG/ML  SOLN COMPARISON:  July 21, 2018 FINDINGS: Lower chest: The visualized heart size within normal limits. No pericardial fluid/thickening. There is a small hiatal hernia present The visualized portions of the lungs are clear. Hepatobiliary: The liver is normal in density without focal abnormality.The main portal vein is patent. No evidence of calcified gallstones, gallbladder wall thickening or biliary dilatation. Pancreas: Unremarkable. No pancreatic ductal dilatation or surrounding inflammatory changes. Spleen: Normal in size without focal abnormality. Adrenals/Urinary Tract: Both adrenal glands appear normal. The kidneys and collecting system appear normal without  evidence of urinary tract calculus or hydronephrosis. Bladder is unremarkable. Stomach/Bowel: The stomach and proximal small bowel are normal in appearance. Within the mid ileal loops there is mildly dilated loops filled with fluid and air measuring up to 3.3 cm. There is also fecalization seen within the mid ileal loops with a focal area of narrowing seen distally best seen on the sagittal views, series 6, image 67. The remainder of the distal ileal loops appear to be decompressed. There is a moderate amount of colonic stool present throughout. The appendix is unremarkable. Vascular/Lymphatic: There are no enlarged mesenteric, retroperitoneal, or pelvic lymph nodes. Scattered aortic atherosclerotic calcifications are seen without aneurysmal dilatation. Reproductive: The patient is status post hysterectomy. No adnexal  masses or collections seen. Other: No evidence of abdominal wall mass or hernia. Musculoskeletal: No acute or significant osseous findings. IMPRESSION: Findings suggestive of partial small bowel obstruction with a focal area of narrowing within the distal ileal loops as described above. Aortic Atherosclerosis (ICD10-I70.0). Electronically Signed   By: Prudencio Pair M.D.   On: 07/29/2020 19:28    SIGNED: Deatra James, MD, FACP, FHM. [facility],  Pager (please use [email or web address] to page/[email or web address])  If 7PM-7AM, please contact night-coverage [email or web address] 07/30/2020, 7:54 AM

## 2020-07-31 ENCOUNTER — Inpatient Hospital Stay: Payer: Medicare Other

## 2020-07-31 LAB — BASIC METABOLIC PANEL
Anion gap: 7 (ref 5–15)
BUN: 11 mg/dL (ref 8–23)
CO2: 29 mmol/L (ref 22–32)
Calcium: 8.6 mg/dL — ABNORMAL LOW (ref 8.9–10.3)
Chloride: 103 mmol/L (ref 98–111)
Creatinine, Ser: 0.58 mg/dL (ref 0.44–1.00)
GFR calc Af Amer: >60 mL/min (ref >60)
GFR calc non Af Amer: >60 mL/min (ref >60)
Glucose, Bld: 81 mg/dL (ref 70–99)
Potassium: 3.1 mmol/L — ABNORMAL LOW (ref 3.5–5.1)
Sodium: 139 mmol/L (ref 135–145)

## 2020-07-31 MED ORDER — POTASSIUM CHLORIDE 10 MEQ/100ML IV SOLN
10.0000 meq | INTRAVENOUS | Status: AC
  Administered 2020-07-31 (×2): 10 meq via INTRAVENOUS
  Filled 2020-07-31 (×2): qty 100

## 2020-07-31 MED ORDER — POTASSIUM CHLORIDE 10 MEQ/100ML IV SOLN
10.0000 meq | INTRAVENOUS | Status: AC
  Administered 2020-07-31 (×4): 10 meq via INTRAVENOUS
  Filled 2020-07-31 (×2): qty 100

## 2020-07-31 MED ORDER — DOCUSATE SODIUM 100 MG PO CAPS
200.0000 mg | ORAL_CAPSULE | Freq: Two times a day (BID) | ORAL | Status: DC
  Administered 2020-07-31 – 2020-08-02 (×4): 200 mg via ORAL
  Filled 2020-07-31 (×6): qty 2

## 2020-07-31 NOTE — ED Notes (Signed)
Patient became nauseated, so I have given her zofran.  She denies pain.

## 2020-08-01 ENCOUNTER — Inpatient Hospital Stay: Payer: Medicare Other

## 2020-08-01 LAB — BASIC METABOLIC PANEL
Anion gap: 9 (ref 5–15)
BUN: 11 mg/dL (ref 8–23)
CO2: 26 mmol/L (ref 22–32)
Calcium: 8.7 mg/dL — ABNORMAL LOW (ref 8.9–10.3)
Chloride: 103 mmol/L (ref 98–111)
Creatinine, Ser: 0.58 mg/dL (ref 0.44–1.00)
GFR calc Af Amer: >60 mL/min (ref >60)
GFR calc non Af Amer: >60 mL/min (ref >60)
Glucose, Bld: 56 mg/dL — ABNORMAL LOW (ref 70–99)
Potassium: 3.4 mmol/L — ABNORMAL LOW (ref 3.5–5.1)
Sodium: 138 mmol/L (ref 135–145)

## 2020-08-01 MED ORDER — SODIUM CHLORIDE 0.9 % IV SOLN: INTRAVENOUS | Status: AC

## 2020-08-01 MED ORDER — SENNOSIDES-DOCUSATE SODIUM 8.6-50 MG PO TABS
1.0000 | ORAL_TABLET | Freq: Once | ORAL | Status: DC
  Filled 2020-08-01: qty 1

## 2020-08-01 NOTE — Progress Notes (Signed)
PROGRESS NOTE    Patient: Rebecca Salinas                            PCP: Boykin Nearing, MD                    DOB: 1946-03-03            DOA: 07/29/2020 ZRA:[id number]             DOS: 08/01/2020, 11:02 AM   LOS: 2 days   Date of Service: The patient was seen and examined on 08/01/2020  Subjective:   The patient was seen and examined this morning, remained stable no acute distress Reporting of progressive fluctuant But no bowel movements Still n.p.o. Improved nausea vomiting  Brief Narrative:   Rebecca Salinas is a 74 y.o. female with medical history significant of GERD, carpal tunnel syndrome, energy of disc disease of C-spine, osteopenia, osteoporosis seen in the emergency room today for nausea vomiting abdominal pain that started on Thursday.  ED: Vitals were stable, with exception of elevated blood pressure as high as 189/91  labs within normal limits, lactic acid 1.6, 1.5 CT of abdomen pelvis: IMPRESSION: Findings suggestive of partial small bowel obstruction with a focal area of narrowing within the distal ileal loops as described above.  Patient subsequently admitted for partial small bowel obstruction..    Assessment & Plan:   Principal Problem:   Small bowel obstruction, partial (HCC) Active Problems:   Nausea & vomiting   Gastroesophageal reflux disease without esophagitis   Partial small bowel obstruction (HCC)    Principal Problem:   Small bowel obstruction, partial (HCC) -Remains n.p.o., positive bowel sounds and gas now -Repeat KUB still reporting -findings consistent with possible partial SBO -As her abdomen exam is benign, positive bowel sounds, reporting gas  -we will initiate clear liquid diet today  -Reports of no nausea or vomiting, NG tube was not inserted on this admission -Continue IV fluid hydration -Monitoring electrolytes -Supportive therapy -If no improvement concerning consulting general surgery   Intractable nausea  vomiting -Due to partial small bowel obstruction -Improved, -As needed antiemetics -IV fluid resuscitation -no NG tube placement  History of GERD without esophagitis -We will continue IV PPI -Stable  History of osteoporosis, osteopenia -Follow the PCP as an outpatient currently n.p.o. -Medication be considered including vitamin D supplements as an outpatient   Accelerated hypertension -Blood pressure was transiently elevated -We will continue as needed hydralazine -We will monitor BP closely, will consider initiating oral medication upon discharge if needed     Nutritional status:          Cultures; None   Antimicrobials: None     Consultants: None   ------------------------------------------------------------------------------------------------------------------------------------------------  DVT prophylaxis:  SCD/Compression stockings and Lovenox SQ Code Status:   Code Status: Full Code Family Communication: No family member present at bedside-discussed with patient The above findings and plan of care has been discussed with patient  in detail,  they expressed understanding and agreement of above.   Admission status:    Status is: Observation  The patient remains OBS appropriate and will d/c before 2 midnights.  Dispo: The patient is from: Home              Anticipated d/c is to: Home              Anticipated d/c date is: In 1 day as soon as bowel function  returns              Patient currently is not medically stable to d/c.        Procedures:   No admission procedures for hospital encounter.     Antimicrobials:  Anti-infectives (From admission, onward)   None       Medication:  . docusate sodium  200 mg Oral BID  . enoxaparin (LOVENOX) injection  40 mg Subcutaneous Q24H  . ketorolac  30 mg Intravenous Once  . melatonin  2.5 mg Oral QHS  . pantoprazole (PROTONIX) IV  40 mg Intravenous Q12H  . senna-docusate  1 tablet Oral Once   . sodium chloride flush  3 mL Intravenous Q12H    hydrALAZINE, HYDROmorphone (DILAUDID) injection, ondansetron **OR** ondansetron (ZOFRAN) IV   Objective:   Vitals:   07/31/20 1932 08/01/20 0101 08/01/20 0346 08/01/20 0450  BP: (!) 165/76 126/69 (!) 142/83 (!) 158/78  Pulse: 69 70 63 60  Resp: 16 16 16 18   Temp: 98.1 F (36.7 C) 98.3 F (36.8 C) (!) 97.5 F (36.4 C) 98.5 F (36.9 C)  TempSrc: Oral Oral Oral Oral  SpO2: 100% 97% 98% 99%  Weight:      Height:        Intake/Output Summary (Last 24 hours) at 08/01/2020 1102 Last data filed at 07/31/2020 1800 Gross per 24 hour  Intake 50 ml  Output --  Net 50 ml   Filed Weights   07/29/20 1129  Weight: 72.1 kg     Examination:     Physical Exam:   General:  Alert, oriented, cooperative, no distress;   HEENT:  Normocephalic, PERRL, otherwise with in Normal limits   Neuro:  CNII-XII intact. , normal motor and sensation, reflexes intact   Lungs:   Clear to auscultation BL, Respirations unlabored, no wheezes / crackles  Cardio:    S1/S2, RRR, No murmure, No Rubs or Gallops   Abdomen:   Soft, non-tender, bowel sounds active all four quadrants,  no guarding or peritoneal signs.  Muscular skeletal:  Limited exam - in bed, able to move all 4 extremities, Normal strength,  2+ pulses,  symmetric, No pitting edema  Skin:  Dry, warm to touch, negative for any Rashes, No open wounds  Wounds: Please see nursing documentation         ------------------------------------------------------------------------------------------------------------------------------------------    LABs:  CBC Latest Ref Rng & Units 07/30/2020 07/29/2020 07/29/2020  WBC 4.0 - 10.5 K/uL 6.1 7.8 7.5  Hemoglobin 12.0 - 15.0 g/dL 12.0 12.8 13.5  Hematocrit 36 - 46 % 35.8(L) 37.3 41.4  Platelets 150 - 400 K/uL 217 251 278   CMP Latest Ref Rng & Units 08/01/2020 07/31/2020 07/30/2020  Glucose 70 - 99 mg/dL 56(L) 81 115(H)  BUN 8 - 23 mg/dL 11 11 17    Creatinine 0.44 - 1.00 mg/dL 0.58 0.58 0.68  Sodium 135 - 145 mmol/L 138 139 139  Potassium 3.5 - 5.1 mmol/L 3.4(L) 3.1(L) 3.6  Chloride 98 - 111 mmol/L 103 103 106  CO2 22 - 32 mmol/L 26 29 27   Calcium 8.9 - 10.3 mg/dL 8.7(L) 8.6(L) 8.5(L)  Total Protein 6.5 - 8.1 g/dL - - 6.4(L)  Total Bilirubin 0.3 - 1.2 mg/dL - - 1.2  Alkaline Phos 38 - 126 U/L - - 77  AST 15 - 41 U/L - - 23  ALT 0 - 44 U/L - - 12       Micro Results Recent Results (from the past 240 hour(s))  Respiratory Panel by RT PCR (Flu A&B, Covid) - Nasopharyngeal Swab     Status: None   Collection Time: 07/29/20  5:49 PM   Specimen: Nasopharyngeal Swab  Result Value Ref Range Status   SARS Coronavirus 2 by RT PCR NEGATIVE NEGATIVE Final    Comment: (NOTE) SARS-CoV-2 target nucleic acids are NOT DETECTED.  The SARS-CoV-2 RNA is generally detectable in upper respiratoy specimens during the acute phase of infection. The lowest concentration of SARS-CoV-2 viral copies this assay can detect is 131 copies/mL. A negative result does not preclude SARS-Cov-2 infection and should not be used as the sole basis for treatment or other patient management decisions. A negative result may occur with  improper specimen collection/handling, submission of specimen other than nasopharyngeal swab, presence of viral mutation(s) within the areas targeted by this assay, and inadequate number of viral copies (<131 copies/mL). A negative result must be combined with clinical observations, patient history, and epidemiological information. The expected result is Negative.  Fact Sheet for Patients:  PinkCheek.be  Fact Sheet for Healthcare Providers:  GravelBags.it  This test is no t yet approved or cleared by the [place] FDA and  has been authorized for detection and/or diagnosis of SARS-CoV-2 by FDA under an Emergency Use Authorization (EUA). This EUA will remain  in  effect (meaning this test can be used) for the duration of the COVID-19 declaration under Section 564(b)(1) of the Act, 21 U.S.C. section 360bbb-3(b)(1), unless the authorization is terminated or revoked sooner.     Influenza A by PCR NEGATIVE NEGATIVE Final   Influenza B by PCR NEGATIVE NEGATIVE Final    Comment: (NOTE) The Xpert Xpress SARS-CoV-2/FLU/RSV assay is intended as an aid in  the diagnosis of influenza from Nasopharyngeal swab specimens and  should not be used as a sole basis for treatment. Nasal washings and  aspirates are unacceptable for Xpert Xpress SARS-CoV-2/FLU/RSV  testing.  Fact Sheet for Patients: PinkCheek.be  Fact Sheet for Healthcare Providers: GravelBags.it  This test is not yet approved or cleared by the [place] FDA and  has been authorized for detection and/or diagnosis of SARS-CoV-2 by  FDA under an Emergency Use Authorization (EUA). This EUA will remain  in effect (meaning this test can be used) for the duration of the  Covid-19 declaration under Section 564(b)(1) of the Act, 21  U.S.C. section 360bbb-3(b)(1), unless the authorization is  terminated or revoked. Performed at [facility], [address], [place], [place] [address]     Radiology Reports DG Abd 1 View  Result Date: 08/01/2020 CLINICAL DATA:  Small-bowel obstruction EXAM: ABDOMEN - 1 VIEW COMPARISON:  07/31/2020, 07/29/2020 FINDINGS: AP supine view of the abdomen again demonstrates air distended loops of large and small bowel throughout the abdomen. Degree of small-bowel dilatation within the lower abdomen appears similar to prior. No gross free intraperitoneal air. Postsurgical changes related to prior left-sided hernia repair. IMPRESSION: Persistent air distended loops of large and small bowel throughout the abdomen. Degree of small-bowel dilatation appears similar to prior. Findings remain suspicious for partial  small bowel obstruction and/or ileus. Electronically Signed   By: Davina Poke D.O.   On: 08/01/2020 10:15   DG Abd 1 View  Result Date: 07/31/2020 CLINICAL DATA:  Lower abdominal pain for 4 days EXAM: ABDOMEN - 1 VIEW COMPARISON:  CT 07/29/2020 FINDINGS: Air distended loops of large and small bowel within the abdomen. No gross free intraperitoneal air on supine view. Postsurgical changes related to prior left-sided hernia  repair. IMPRESSION: Air distended loops of large and small bowel within the abdomen, which may reflect partial obstruction and/or ileus. Electronically Signed   By: Davina Poke D.O.   On: 07/31/2020 07:54   CT ABDOMEN PELVIS W CONTRAST  Result Date: 07/29/2020 CLINICAL DATA:  Abdominal pain EXAM: CT ABDOMEN AND PELVIS WITH CONTRAST TECHNIQUE: Multidetector CT imaging of the abdomen and pelvis was performed using the standard protocol following bolus administration of intravenous contrast. CONTRAST:  128mL OMNIPAQUE IOHEXOL 300 MG/ML  SOLN COMPARISON:  July 21, 2018 FINDINGS: Lower chest: The visualized heart size within normal limits. No pericardial fluid/thickening. There is a small hiatal hernia present The visualized portions of the lungs are clear. Hepatobiliary: The liver is normal in density without focal abnormality.The main portal vein is patent. No evidence of calcified gallstones, gallbladder wall thickening or biliary dilatation. Pancreas: Unremarkable. No pancreatic ductal dilatation or surrounding inflammatory changes. Spleen: Normal in size without focal abnormality. Adrenals/Urinary Tract: Both adrenal glands appear normal. The kidneys and collecting system appear normal without evidence of urinary tract calculus or hydronephrosis. Bladder is unremarkable. Stomach/Bowel: The stomach and proximal small bowel are normal in appearance. Within the mid ileal loops there is mildly dilated loops filled with fluid and air measuring up to 3.3 cm. There is also  fecalization seen within the mid ileal loops with a focal area of narrowing seen distally best seen on the sagittal views, series 6, image 67. The remainder of the distal ileal loops appear to be decompressed. There is a moderate amount of colonic stool present throughout. The appendix is unremarkable. Vascular/Lymphatic: There are no enlarged mesenteric, retroperitoneal, or pelvic lymph nodes. Scattered aortic atherosclerotic calcifications are seen without aneurysmal dilatation. Reproductive: The patient is status post hysterectomy. No adnexal masses or collections seen. Other: No evidence of abdominal wall mass or hernia. Musculoskeletal: No acute or significant osseous findings. IMPRESSION: Findings suggestive of partial small bowel obstruction with a focal area of narrowing within the distal ileal loops as described above. Aortic Atherosclerosis (ICD10-I70.0). Electronically Signed   By: Prudencio Pair M.D.   On: 07/29/2020 19:28    SIGNED: Deatra James, MD, FACP, FHM. [facility],  Pager (please use [email or web address] to page/text)  If 7PM-7AM, please contact night-coverage [email or web address], Password [facility] 08/01/2020, 11:02 AM

## 2020-08-02 LAB — BASIC METABOLIC PANEL
Anion gap: 8 (ref 5–15)
BUN: 6 mg/dL — ABNORMAL LOW (ref 8–23)
CO2: 26 mmol/L (ref 22–32)
Calcium: 8.7 mg/dL — ABNORMAL LOW (ref 8.9–10.3)
Chloride: 103 mmol/L (ref 98–111)
Creatinine, Ser: 0.61 mg/dL (ref 0.44–1.00)
GFR calc non Af Amer: >60 mL/min (ref >60)
Glucose, Bld: 98 mg/dL (ref 70–99)
Potassium: 3.5 mmol/L (ref 3.5–5.1)
Sodium: 137 mmol/L (ref 135–145)

## 2020-08-02 MED ORDER — DOCUSATE SODIUM 100 MG PO CAPS: 200.0000 mg | ORAL_CAPSULE | Freq: Two times a day (BID) | ORAL | 0 refills | Status: DC

## 2020-08-02 NOTE — Care Management Important Message (Signed)
Important Message  Patient Details  Name: Rebecca Salinas MRN: [id number] Date of Birth: Apr 26, 1946   Medicare Important Message Given:  Yes     Dannette Barbara 08/02/2020, 11:31 AM

## 2020-08-02 NOTE — Discharge Summary (Signed)
Physician Discharge Summary  Rebecca Salinas HCW:[id number] DOB: 02/15/1946 DOA: 07/29/2020  PCP: Boykin Nearing, MD  Admit date: 07/29/2020 Discharge date: 08/02/2020  Admitted From: home Disposition:  home  Recommendations for Outpatient Follow-up:  1. Follow up with PCP in 1-2 weeks 2. Please obtain BMP/CBC in one week   Home Health: No  Equipment/Devices: None   Discharge Condition: Stable  CODE STATUS: Full  Diet recommendation: Soft diet, advance as tolerated    Discharge Diagnoses: Principal Problem:   Small bowel obstruction, partial (HCC) Active Problems:   Gastroesophageal reflux disease without esophagitis   Nausea & vomiting   Partial small bowel obstruction (HCC)    Summary of HPI and Hospital Course:  Rebecca Salinas is a 74 y.o. female with medical history significant of GERD, carpal tunnel syndrome, energy of disc disease of C-spine, osteopenia, osteoporosis seen in the emergency room today for nausea vomiting abdominal pain that started on Thursday.   ED: Vitals were stable, with exception of elevated blood pressure as high as 189/91  labs within normal limits, lactic acid 1.6, 1.5 CT of abdomen pelvis: IMPRESSION: Findings suggestive of partial small bowel obstruction with a focal area of narrowing within the distal ileal loops as described above.   Patient subsequently admitted for partial small bowel obstruction..    Treated conservatively with supportive measures including IV fluids.  NG tube was not inserted given absence of nausea/vomiting.  Patient slowly improved, diet was advanced and patient tolerating well at the time of discharge.      Discharge Instructions   Discharge Instructions    Call MD for:  extreme fatigue   Complete by: As directed    Call MD for:  persistant dizziness or light-headedness   Complete by: As directed    Call MD for:  persistant nausea and vomiting   Complete by: As directed    Call MD for:  severe  uncontrolled pain   Complete by: As directed    Call MD for:  temperature >100.4   Complete by: As directed    Diet - low sodium heart healthy   Complete by: As directed    Increase activity slowly   Complete by: As directed      Allergies as of 08/02/2020      Reactions   Norflex [orphenadrine Citrate]    Lightheaded    Lodine [etodolac] Itching, Rash      Medication List    TAKE these medications   docusate sodium 100 MG capsule Commonly known as: COLACE Take 2 capsules (200 mg total) by mouth 2 (two) times daily.   esomeprazole 40 MG capsule Commonly known as: NEXIUM Take 40 mg by mouth every morning.   vitamin C 500 MG tablet Commonly known as: ASCORBIC ACID Take 500 mg by mouth as needed.       Allergies  Allergen Reactions  . Norflex [Orphenadrine Citrate]     Lightheaded   . Lodine [Etodolac] Itching and Rash    Consultations:  None    Procedures/Studies: DG Abd 1 View  Result Date: 08/01/2020 CLINICAL DATA:  Small-bowel obstruction EXAM: ABDOMEN - 1 VIEW COMPARISON:  07/31/2020, 07/29/2020 FINDINGS: AP supine view of the abdomen again demonstrates air distended loops of large and small bowel throughout the abdomen. Degree of small-bowel dilatation within the lower abdomen appears similar to prior. No gross free intraperitoneal air. Postsurgical changes related to prior left-sided hernia repair. IMPRESSION: Persistent air distended loops of large and small bowel throughout the  abdomen. Degree of small-bowel dilatation appears similar to prior. Findings remain suspicious for partial small bowel obstruction and/or ileus. Electronically Signed   By: Davina Poke D.O.   On: 08/01/2020 10:15   DG Abd 1 View  Result Date: 07/31/2020 CLINICAL DATA:  Lower abdominal pain for 4 days EXAM: ABDOMEN - 1 VIEW COMPARISON:  CT 07/29/2020 FINDINGS: Air distended loops of large and small bowel within the abdomen. No gross free intraperitoneal air on supine view.  Postsurgical changes related to prior left-sided hernia repair. IMPRESSION: Air distended loops of large and small bowel within the abdomen, which may reflect partial obstruction and/or ileus. Electronically Signed   By: Davina Poke D.O.   On: 07/31/2020 07:54   CT ABDOMEN PELVIS W CONTRAST  Result Date: 07/29/2020 CLINICAL DATA:  Abdominal pain EXAM: CT ABDOMEN AND PELVIS WITH CONTRAST TECHNIQUE: Multidetector CT imaging of the abdomen and pelvis was performed using the standard protocol following bolus administration of intravenous contrast. CONTRAST:  179mL OMNIPAQUE IOHEXOL 300 MG/ML  SOLN COMPARISON:  July 21, 2018 FINDINGS: Lower chest: The visualized heart size within normal limits. No pericardial fluid/thickening. There is a small hiatal hernia present The visualized portions of the lungs are clear. Hepatobiliary: The liver is normal in density without focal abnormality.The main portal vein is patent. No evidence of calcified gallstones, gallbladder wall thickening or biliary dilatation. Pancreas: Unremarkable. No pancreatic ductal dilatation or surrounding inflammatory changes. Spleen: Normal in size without focal abnormality. Adrenals/Urinary Tract: Both adrenal glands appear normal. The kidneys and collecting system appear normal without evidence of urinary tract calculus or hydronephrosis. Bladder is unremarkable. Stomach/Bowel: The stomach and proximal small bowel are normal in appearance. Within the mid ileal loops there is mildly dilated loops filled with fluid and air measuring up to 3.3 cm. There is also fecalization seen within the mid ileal loops with a focal area of narrowing seen distally best seen on the sagittal views, series 6, image 67. The remainder of the distal ileal loops appear to be decompressed. There is a moderate amount of colonic stool present throughout. The appendix is unremarkable. Vascular/Lymphatic: There are no enlarged mesenteric, retroperitoneal, or pelvic  lymph nodes. Scattered aortic atherosclerotic calcifications are seen without aneurysmal dilatation. Reproductive: The patient is status post hysterectomy. No adnexal masses or collections seen. Other: No evidence of abdominal wall mass or hernia. Musculoskeletal: No acute or significant osseous findings. IMPRESSION: Findings suggestive of partial small bowel obstruction with a focal area of narrowing within the distal ileal loops as described above. Aortic Atherosclerosis (ICD10-I70.0). Electronically Signed   By: Prudencio Pair M.D.   On: 07/29/2020 19:28       Subjective: Pt seen this AM.  Tolerating diet without issue.  No other acute complaints.  Looks forward to going home.   Discharge Exam: Vitals:   08/02/20 0435 08/02/20 1133  BP: (!) 144/65 (!) 130/92  Pulse: 71 73  Resp: 20   Temp: 98.3 F (36.8 C) (!) 97.5 F (36.4 C)  SpO2: 99% 97%   Vitals:   08/01/20 2109 08/01/20 2319 08/02/20 0435 08/02/20 1133  BP: (!) 172/88 (!) 172/84 (!) 144/65 (!) 130/92  Pulse: 68 75 71 73  Resp: 20 16 20    Temp: 98.4 F (36.9 C) 99.2 F (37.3 C) 98.3 F (36.8 C) (!) 97.5 F (36.4 C)  TempSrc: Oral Oral Oral Oral  SpO2: 99% 94% 99% 97%  Weight:      Height:        General: Pt  is alert, awake, not in acute distress Cardiovascular: RRR, S1/S2 +, no rubs, no gallops Respiratory: CTA bilaterally, no wheezing, no rhonchi Abdominal: Soft, NT, ND, bowel sounds + Extremities: no edema, no cyanosis    The results of significant diagnostics from this hospitalization (including imaging, microbiology, ancillary and laboratory) are listed below for reference.     Microbiology: Recent Results (from the past 240 hour(s))  Respiratory Panel by RT PCR (Flu A&B, Covid) - Nasopharyngeal Swab     Status: None   Collection Time: 07/29/20  5:49 PM   Specimen: Nasopharyngeal Swab  Result Value Ref Range Status   SARS Coronavirus 2 by RT PCR NEGATIVE NEGATIVE Final    Comment: (NOTE) SARS-CoV-2  target nucleic acids are NOT DETECTED.  The SARS-CoV-2 RNA is generally detectable in upper respiratoy specimens during the acute phase of infection. The lowest concentration of SARS-CoV-2 viral copies this assay can detect is 131 copies/mL. A negative result does not preclude SARS-Cov-2 infection and should not be used as the sole basis for treatment or other patient management decisions. A negative result may occur with  improper specimen collection/handling, submission of specimen other than nasopharyngeal swab, presence of viral mutation(s) within the areas targeted by this assay, and inadequate number of viral copies (<131 copies/mL). A negative result must be combined with clinical observations, patient history, and epidemiological information. The expected result is Negative.  Fact Sheet for Patients:  PinkCheek.be  Fact Sheet for Healthcare Providers:  GravelBags.it  This test is no t yet approved or cleared by the [place] FDA and  has been authorized for detection and/or diagnosis of SARS-CoV-2 by FDA under an Emergency Use Authorization (EUA). This EUA will remain  in effect (meaning this test can be used) for the duration of the COVID-19 declaration under Section 564(b)(1) of the Act, 21 U.S.C. section 360bbb-3(b)(1), unless the authorization is terminated or revoked sooner.     Influenza A by PCR NEGATIVE NEGATIVE Final   Influenza B by PCR NEGATIVE NEGATIVE Final    Comment: (NOTE) The Xpert Xpress SARS-CoV-2/FLU/RSV assay is intended as an aid in  the diagnosis of influenza from Nasopharyngeal swab specimens and  should not be used as a sole basis for treatment. Nasal washings and  aspirates are unacceptable for Xpert Xpress SARS-CoV-2/FLU/RSV  testing.  Fact Sheet for Patients: PinkCheek.be  Fact Sheet for Healthcare  Providers: GravelBags.it  This test is not yet approved or cleared by the [place] FDA and  has been authorized for detection and/or diagnosis of SARS-CoV-2 by  FDA under an Emergency Use Authorization (EUA). This EUA will remain  in effect (meaning this test can be used) for the duration of the  Covid-19 declaration under Section 564(b)(1) of the Act, 21  U.S.C. section 360bbb-3(b)(1), unless the authorization is  terminated or revoked. Performed at [facility], [address] [place], [place] [address]      Labs: BNP (last 3 results) No results for input(s): BNP in the last 8760 hours. Basic Metabolic Panel: Recent Labs  Lab 07/29/20 1136 07/29/20 1136 07/29/20 1708 07/29/20 2150 07/30/20 0347 07/31/20 0548 08/01/20 0409 08/02/20 0322  NA 135  --   --   --  139 139 138 137  K 3.6  --   --   --  3.6 3.1* 3.4* 3.5  CL 99  --   --   --  106 103 103 103  CO2 25  --   --   --  27  29 26 26   GLUCOSE 147*  --   --   --  115* 81 56* 98  BUN 17  --   --   --  17 11 11  6*  CREATININE 0.81   < >  --  0.73 0.68 0.58 0.58 0.61  CALCIUM 10.0  --   --   --  8.5* 8.6* 8.7* 8.7*  MG  --   --  2.2  --   --   --   --   --    < > = values in this interval not displayed.   Liver Function Tests: Recent Labs  Lab 07/29/20 1136 07/30/20 0347  AST 20 23  ALT 14 12  ALKPHOS 105 77  BILITOT 1.3* 1.2  PROT 7.8 6.4*  ALBUMIN 4.2 3.3*   Recent Labs  Lab 07/29/20 1136 07/29/20 2150  LIPASE 23 22   No results for input(s): AMMONIA in the last 168 hours. CBC: Recent Labs  Lab 07/29/20 1136 07/29/20 2150 07/30/20 0347  WBC 7.5 7.8 6.1  NEUTROABS  --   --  4.2  HGB 13.5 12.8 12.0  HCT 41.4 37.3 35.8*  MCV 87.0 84.2 85.6  PLT 278 251 217   Cardiac Enzymes: Recent Labs  Lab 07/30/20 0347  CKTOTAL 198   BNP: Invalid input(s): POCBNP CBG: No results for input(s): GLUCAP in the last 168 hours. D-Dimer No results for input(s):  DDIMER in the last 72 hours. Hgb A1c No results for input(s): HGBA1C in the last 72 hours. Lipid Profile No results for input(s): CHOL, HDL, LDLCALC, TRIG, CHOLHDL, LDLDIRECT in the last 72 hours. Thyroid function studies No results for input(s): TSH, T4TOTAL, T3FREE, THYROIDAB in the last 72 hours.  Invalid input(s): FREET3 Anemia work up No results for input(s): VITAMINB12, FOLATE, FERRITIN, TIBC, IRON, RETICCTPCT in the last 72 hours. Urinalysis    Component Value Date/Time   COLORURINE AMBER (A) 07/29/2020 1136   APPEARANCEUR HAZY (A) 07/29/2020 1136   APPEARANCEUR Cloudy 01/04/2013 1854   LABSPEC 1.030 07/29/2020 1136   LABSPEC 1.014 01/04/2013 1854   PHURINE 5.0 07/29/2020 1136   GLUCOSEU NEGATIVE 07/29/2020 1136   GLUCOSEU Negative 01/04/2013 1854   HGBUR NEGATIVE 07/29/2020 1136   BILIRUBINUR NEGATIVE 07/29/2020 1136   BILIRUBINUR Negative 01/04/2013 1854   KETONESUR 5 (A) 07/29/2020 1136   PROTEINUR 30 (A) 07/29/2020 1136   NITRITE NEGATIVE 07/29/2020 1136   LEUKOCYTESUR NEGATIVE 07/29/2020 1136   LEUKOCYTESUR 3+ 01/04/2013 1854   Sepsis Labs Invalid input(s): PROCALCITONIN,  WBC,  LACTICIDVEN Microbiology Recent Results (from the past 240 hour(s))  Respiratory Panel by RT PCR (Flu A&B, Covid) - Nasopharyngeal Swab     Status: None   Collection Time: 07/29/20  5:49 PM   Specimen: Nasopharyngeal Swab  Result Value Ref Range Status   SARS Coronavirus 2 by RT PCR NEGATIVE NEGATIVE Final    Comment: (NOTE) SARS-CoV-2 target nucleic acids are NOT DETECTED.  The SARS-CoV-2 RNA is generally detectable in upper respiratoy specimens during the acute phase of infection. The lowest concentration of SARS-CoV-2 viral copies this assay can detect is 131 copies/mL. A negative result does not preclude SARS-Cov-2 infection and should not be used as the sole basis for treatment or other patient management decisions. A negative result may occur with  improper specimen  collection/handling, submission of specimen other than nasopharyngeal swab, presence of viral mutation(s) within the areas targeted by this assay, and inadequate number of viral copies (<131 copies/mL). A negative result must  be combined with clinical observations, patient history, and epidemiological information. The expected result is Negative.  Fact Sheet for Patients:  PinkCheek.be  Fact Sheet for Healthcare Providers:  GravelBags.it  This test is no t yet approved or cleared by the [place] FDA and  has been authorized for detection and/or diagnosis of SARS-CoV-2 by FDA under an Emergency Use Authorization (EUA). This EUA will remain  in effect (meaning this test can be used) for the duration of the COVID-19 declaration under Section 564(b)(1) of the Act, 21 U.S.C. section 360bbb-3(b)(1), unless the authorization is terminated or revoked sooner.     Influenza A by PCR NEGATIVE NEGATIVE Final   Influenza B by PCR NEGATIVE NEGATIVE Final    Comment: (NOTE) The Xpert Xpress SARS-CoV-2/FLU/RSV assay is intended as an aid in  the diagnosis of influenza from Nasopharyngeal swab specimens and  should not be used as a sole basis for treatment. Nasal washings and  aspirates are unacceptable for Xpert Xpress SARS-CoV-2/FLU/RSV  testing.  Fact Sheet for Patients: PinkCheek.be  Fact Sheet for Healthcare Providers: GravelBags.it  This test is not yet approved or cleared by the [place] FDA and  has been authorized for detection and/or diagnosis of SARS-CoV-2 by  FDA under an Emergency Use Authorization (EUA). This EUA will remain  in effect (meaning this test can be used) for the duration of the  Covid-19 declaration under Section 564(b)(1) of the Act, 21  U.S.C. section 360bbb-3(b)(1), unless the authorization is  terminated or revoked. Performed at [facility], [address] [place], [place] [address]      Time coordinating discharge: Over 30 minutes  SIGNED:   Ezekiel Slocumb, DO [facility] 08/02/2020, 3:45 PM   If 7PM-7AM, please contact night-coverage [email or web address]

## 2020-08-02 NOTE — Hospital Course (Signed)
Rebecca Salinas is a 74 y.o. female with medical history significant of GERD, carpal tunnel syndrome, energy of disc disease of C-spine, osteopenia, osteoporosis seen in the emergency room today for nausea vomiting abdominal pain that started on Thursday.   ED: Vitals were stable, with exception of elevated blood pressure as high as 189/91  labs within normal limits, lactic acid 1.6, 1.5 CT of abdomen pelvis: IMPRESSION: Findings suggestive of partial small bowel obstruction with a focal area of narrowing within the distal ileal loops as described above.   Patient subsequently admitted for partial small bowel obstruction.Marland Kitchen

## 2020-08-08 ENCOUNTER — Ambulatory Visit: Payer: Self-pay | Admitting: Surgery

## 2020-08-08 DIAGNOSIS — R110 Nausea: Secondary | ICD-10-CM | POA: Diagnosis not present

## 2020-08-08 DIAGNOSIS — K436 Other and unspecified ventral hernia with obstruction, without gangrene: Secondary | ICD-10-CM | POA: Diagnosis not present

## 2020-08-08 NOTE — H&P (Signed)
Subjective:   CC: Ventral hernia with obstruction, without gangrene [K43.6]  HPI:  Rebecca Salinas is a 74 y.o. female who returns for evaluation of above. Now symptomatic.  Symptoms were first noted a few weeks ago. Pain is dull and constant, confined to the epigastric area, without radiation.  Associated with nothing specific, exacerbated by nothing specific  Lump is not reducible.   Recent hospitalization for gastroenteritis vs pSBO.  Still having occasinal nausea, early satiety, but is having regular BMs after eating.   Past Medical History:  has a past medical history of Carpal tunnel syndrome, Cervical disc disease, Epigastric abdominal tenderness (08/20/2018), Fat necrosis, adenomatous colonic polyps (05/23/2017), Neutropenia (CMS-HCC), Osteopenia, Osteoporosis, post-menopausal, and Spondylolisthesis.  Past Surgical History:  Past Surgical History:  Procedure Laterality Date  . COLONOSCOPY  07/18/1993   Int Hemorrhoids  . COLONOSCOPY  04/07/2007   Adenomatous Polyp  . COLONOSCOPY  07/21/2012   PH Adenomatous Polyp: CBF 06/2017; Sch'ed 08/18/2017  . COLONOSCOPY  08/18/2017   Adenomatous Polyp: CBF 07/2022  . EGD  12/11/1994, 07/17/1993  . EGD  08/13/2018   Gastritis: No repeat per RTE  . HERNIA REPAIR Left 10/30/2018   inguinal  . HYSTERECTOMY  1996   LAVH/BSO  . LAVH-BSO  1996  . Right shoulder surgery      Family History: family history includes Diabetes type II in her mother; High blood pressure (Hypertension) in her mother and another family member; Seizures in an other family member.  Social History:  reports that she has never smoked. She has never used smokeless tobacco. She reports current alcohol use. She reports that she does not use drugs.  Current Medications: has a current medication list which includes the following prescription(s): esomeprazole.  Allergies:  Allergies as of 08/08/2020 - Reviewed 08/08/2020  Allergen Reaction Noted  . Lodine [etodolac]  Unknown 09/12/2014  . Norflex [orphenadrine citrate] Unknown 09/12/2014  . Other Unknown 09/12/2014    ROS:  A 15 point review of systems was performed and pertinent positives and negatives noted in HPI   Objective:     BP 133/89   Pulse 74   Ht 157.5 cm (5\' 2" )   Wt 72.6 kg (160 lb)   BMI 29.26 kg/m   Constitutional :  alert, appears stated age, cooperative and no distress  Lymphatics/Throat:  no asymmetry, masses, or scars  Respiratory:  clear to auscultation bilaterally  Cardiovascular:  regular rate and rhythm  Gastrointestinal: soft, non-tender; bowel sounds normal; no masses,  no organomegaly. ventral hernia noted.  moderate, incarcerated, no overlying skin changes and TTP  Musculoskeletal: Steady gait and movement  Skin: Cool and moist  Psychiatric: Normal affect, non-agitated, not confused       LABS:  n/a   RADS: n/a Assessment:       Ventral hernia with obstruction, without gangrene [K43.6]  Nausea, early satiety  Plan:     1. Ventral hernia with obstruction, without gangrene [K43.6]   Discussed the risk of surgery including recurrence, which can be up to 50% in the case of incisional or complex hernias, possible use of prosthetic materials (mesh) and the increased risk of mesh infxn if used, bleeding, chronic pain, post-op infxn, post-op SBO or ileus, and possible re-operation to address said risks. The risks of general anesthetic, if used, includes MI, CVA, sudden death or even reaction to anesthetic medications also discussed. Alternatives include continued observation.  Benefits include possible symptom relief, prevention of incarceration, strangulation, enlargement in size over time, and  the risk of emergency surgery in the face of strangulation.   Typical post-op recovery time of 3-5 days with 2 weeks of activity restrictions were also discussed.  ED return precautions given for sudden increase in pain, size of hernia with accompanying fever, nausea,  and/or vomiting.  The patient verbalized understanding and all questions were answered to the patient's satisfaction.   2. Patient has elected to proceed with surgical treatment. Procedure will be scheduled. Open due to small defect size.  Recent hospitalization of likely gastroenteritis.  Pt still having nausea, early satiety.  Recommended probiotics for now to see if any improvement.

## 2020-08-08 NOTE — H&P (View-Only) (Signed)
Subjective:   CC: Ventral hernia with obstruction, without gangrene [K43.6]  HPI:  Rebecca Salinas is a 74 y.o. female who returns for evaluation of above. Now symptomatic.  Symptoms were first noted a few weeks ago. Pain is dull and constant, confined to the epigastric area, without radiation.  Associated with nothing specific, exacerbated by nothing specific  Lump is not reducible.   Recent hospitalization for gastroenteritis vs pSBO.  Still having occasinal nausea, early satiety, but is having regular BMs after eating.   Past Medical History:  has a past medical history of Carpal tunnel syndrome, Cervical disc disease, Epigastric abdominal tenderness (08/20/2018), Fat necrosis, adenomatous colonic polyps (05/23/2017), Neutropenia (CMS-HCC), Osteopenia, Osteoporosis, post-menopausal, and Spondylolisthesis.  Past Surgical History:  Past Surgical History:  Procedure Laterality Date  . COLONOSCOPY  07/18/1993   Int Hemorrhoids  . COLONOSCOPY  04/07/2007   Adenomatous Polyp  . COLONOSCOPY  07/21/2012   PH Adenomatous Polyp: CBF 06/2017; Sch'ed 08/18/2017  . COLONOSCOPY  08/18/2017   Adenomatous Polyp: CBF 07/2022  . EGD  12/11/1994, 07/17/1993  . EGD  08/13/2018   Gastritis: No repeat per RTE  . HERNIA REPAIR Left 10/30/2018   inguinal  . HYSTERECTOMY  1996   LAVH/BSO  . LAVH-BSO  1996  . Right shoulder surgery      Family History: family history includes Diabetes type II in her mother; High blood pressure (Hypertension) in her mother and another family member; Seizures in an other family member.  Social History:  reports that she has never smoked. She has never used smokeless tobacco. She reports current alcohol use. She reports that she does not use drugs.  Current Medications: has a current medication list which includes the following prescription(s): esomeprazole.  Allergies:  Allergies as of 08/08/2020 - Reviewed 08/08/2020  Allergen Reaction Noted  . Lodine [etodolac]  Unknown 09/12/2014  . Norflex [orphenadrine citrate] Unknown 09/12/2014  . Other Unknown 09/12/2014    ROS:  A 15 point review of systems was performed and pertinent positives and negatives noted in HPI   Objective:     BP 133/89   Pulse 74   Ht 157.5 cm (5\' 2" )   Wt 72.6 kg (160 lb)   BMI 29.26 kg/m   Constitutional :  alert, appears stated age, cooperative and no distress  Lymphatics/Throat:  no asymmetry, masses, or scars  Respiratory:  clear to auscultation bilaterally  Cardiovascular:  regular rate and rhythm  Gastrointestinal: soft, non-tender; bowel sounds normal; no masses,  no organomegaly. ventral hernia noted.  moderate, incarcerated, no overlying skin changes and TTP  Musculoskeletal: Steady gait and movement  Skin: Cool and moist  Psychiatric: Normal affect, non-agitated, not confused       LABS:  n/a   RADS: n/a Assessment:       Ventral hernia with obstruction, without gangrene [K43.6]  Nausea, early satiety  Plan:     1. Ventral hernia with obstruction, without gangrene [K43.6]   Discussed the risk of surgery including recurrence, which can be up to 50% in the case of incisional or complex hernias, possible use of prosthetic materials (mesh) and the increased risk of mesh infxn if used, bleeding, chronic pain, post-op infxn, post-op SBO or ileus, and possible re-operation to address said risks. The risks of general anesthetic, if used, includes MI, CVA, sudden death or even reaction to anesthetic medications also discussed. Alternatives include continued observation.  Benefits include possible symptom relief, prevention of incarceration, strangulation, enlargement in size over time, and  the risk of emergency surgery in the face of strangulation.   Typical post-op recovery time of 3-5 days with 2 weeks of activity restrictions were also discussed.  ED return precautions given for sudden increase in pain, size of hernia with accompanying fever, nausea,  and/or vomiting.  The patient verbalized understanding and all questions were answered to the patient's satisfaction.   2. Patient has elected to proceed with surgical treatment. Procedure will be scheduled. Open due to small defect size.  Recent hospitalization of likely gastroenteritis.  Pt still having nausea, early satiety.  Recommended probiotics for now to see if any improvement.

## 2020-08-14 ENCOUNTER — Other Ambulatory Visit: Payer: Self-pay

## 2020-08-14 ENCOUNTER — Encounter
Admission: RE | Admit: 2020-08-14 | Discharge: 2020-08-14 | Disposition: A | Discharge status: Home / Self Care | Payer: Medicare Other | Source: Ambulatory Visit | Attending: Surgery | Admitting: Surgery

## 2020-08-14 HISTORY — DX: Unilateral inguinal hernia, without obstruction or gangrene, not specified as recurrent: K40.90

## 2020-08-14 NOTE — Patient Instructions (Addendum)
Your procedure is scheduled on: Thursday, Oct. 21 Report to Day Surgery on the 2nd floor of the [facility]. To find out your arrival time, please call [phone] between 1PM - 3PM on: Wednesday, Oct. 20  REMEMBER: Instructions that are not followed completely may result in serious medical risk, up to and including death; or upon the discretion of your surgeon and anesthesiologist your surgery may need to be rescheduled.  Do not eat food after midnight the night before surgery.  No gum chewing, lozengers or hard candies.  You may however, drink CLEAR liquids up to 2 hours before you are scheduled to arrive for your surgery. Do not drink anything within 2 hours of your scheduled arrival time.  Clear liquids include: - water  - apple juice without pulp - gatorade (not RED, PURPLE, OR BLUE) - black coffee or tea (Do NOT add milk or creamers to the coffee or tea) Do NOT drink anything that is not on this list.  TAKE THESE MEDICATIONS THE MORNING OF SURGERY WITH A SIP OF WATER:  1.  Esomeprazole (Nexium) - (take one the night before and one on the morning of surgery - helps to prevent nausea after surgery.)  One week prior to surgery: Stop Anti-inflammatories (NSAIDS) such as Advil, Aleve, Ibuprofen, Motrin, Naproxen, Naprosyn and Aspirin based products such as Excedrin, Goodys Powder, BC Powder. Stop ANY OVER THE COUNTER supplements until after surgery. (Vitamin C)  No Alcohol for 24 hours before or after surgery.  On the morning of surgery brush your teeth with toothpaste and water, you may rinse your mouth with mouthwash if you wish. Do not swallow any toothpaste or mouthwash.  Do not wear jewelry, make-up, hairpins, clips or nail polish.  Do not wear lotions, powders, or perfumes.   Do not shave 48 hours prior to surgery.   Dentures may not be worn into surgery.  Do not bring valuables to the hospital. [facility] is not responsible for any missing/lost belongings or  valuables.   Use CHG Soap as directed on instruction sheet.  Notify your doctor if there is any change in your medical condition (cold, fever, infection).  Wear comfortable clothing (specific to your surgery type) to the hospital.  Plan for stool softeners for home use; pain medications have a tendency to cause constipation. You can also help prevent constipation by eating foods high in fiber such as fruits and vegetables and drinking plenty of fluids as your diet allows.  After surgery, you can help prevent lung complications by doing breathing exercises.  Take deep breaths and cough every 1-2 hours. Your doctor may order a device called an Incentive Spirometer to help you take deep breaths. When coughing or sneezing, hold a pillow firmly against your incision with both hands. This is called splinting. Doing this helps protect your incision. It also decreases belly discomfort.  If you are being discharged the day of surgery, you will not be allowed to drive home. You will need a responsible adult (18 years or older) to drive you home and stay with you that night.   If you are taking public transportation, you will need to have a responsible adult (18 years or older) with you. Please confirm with your physician that it is acceptable to use public transportation.   Please call the [facility] at [phone] if you have any questions about these instructions.  Visitation Policy:  Patients undergoing a surgery or procedure may have one family member or support  person with them as long as that person is not COVID-19 positive or experiencing its symptoms.  That person may remain in the waiting area during the procedure.  Masking is required regardless of vaccination status.

## 2020-08-15 ENCOUNTER — Other Ambulatory Visit
Admission: RE | Admit: 2020-08-15 | Discharge: 2020-08-15 | Disposition: A | Discharge status: Home / Self Care | Payer: Medicare Other | Source: Ambulatory Visit | Attending: Surgery | Admitting: Surgery

## 2020-08-15 DIAGNOSIS — Z01812 Encounter for preprocedural laboratory examination: Secondary | ICD-10-CM | POA: Insufficient documentation

## 2020-08-15 DIAGNOSIS — Z20822 Contact with and (suspected) exposure to covid-19: Secondary | ICD-10-CM | POA: Diagnosis not present

## 2020-08-16 LAB — SARS CORONAVIRUS 2 (TAT 6-24 HRS): SARS Coronavirus 2: NEGATIVE

## 2020-08-16 MED ORDER — CHLORHEXIDINE GLUCONATE 0.12 % MT SOLN: 15.0000 mL | Freq: Once | OROMUCOSAL | Status: AC

## 2020-08-16 MED ORDER — LACTATED RINGERS IV SOLN: INTRAVENOUS | Status: DC

## 2020-08-16 MED ORDER — ORAL CARE MOUTH RINSE: 15.0000 mL | Freq: Once | OROMUCOSAL | Status: AC

## 2020-08-16 MED ORDER — CEFAZOLIN SODIUM-DEXTROSE 2-4 GM/100ML-% IV SOLN
2.0000 g | INTRAVENOUS | Status: AC
  Administered 2020-08-17: 2 g via INTRAVENOUS

## 2020-08-17 ENCOUNTER — Encounter: Payer: Self-pay | Admitting: Surgery

## 2020-08-17 ENCOUNTER — Encounter
Admission: RE | Disposition: A | Discharge status: Home / Self Care | Payer: Self-pay | Source: Home / Self Care | Attending: Surgery

## 2020-08-17 ENCOUNTER — Ambulatory Visit
Admission: RE | Admit: 2020-08-17 | Discharge: 2020-08-17 | Disposition: A | Discharge status: Home / Self Care | Payer: Medicare Other | Attending: Surgery | Admitting: Surgery

## 2020-08-17 ENCOUNTER — Ambulatory Visit: Payer: Medicare Other | Admitting: Certified Registered"

## 2020-08-17 ENCOUNTER — Other Ambulatory Visit: Payer: Self-pay

## 2020-08-17 DIAGNOSIS — Z79899 Other long term (current) drug therapy: Secondary | ICD-10-CM | POA: Diagnosis not present

## 2020-08-17 DIAGNOSIS — Z8249 Family history of ischemic heart disease and other diseases of the circulatory system: Secondary | ICD-10-CM | POA: Diagnosis not present

## 2020-08-17 DIAGNOSIS — K436 Other and unspecified ventral hernia with obstruction, without gangrene: Secondary | ICD-10-CM | POA: Diagnosis not present

## 2020-08-17 DIAGNOSIS — M1990 Unspecified osteoarthritis, unspecified site: Secondary | ICD-10-CM | POA: Diagnosis not present

## 2020-08-17 DIAGNOSIS — Z90710 Acquired absence of both cervix and uterus: Secondary | ICD-10-CM | POA: Diagnosis not present

## 2020-08-17 DIAGNOSIS — Z86010 Personal history of colonic polyps: Secondary | ICD-10-CM | POA: Insufficient documentation

## 2020-08-17 DIAGNOSIS — M4310 Spondylolisthesis, site unspecified: Secondary | ICD-10-CM | POA: Insufficient documentation

## 2020-08-17 DIAGNOSIS — Z820 Family history of epilepsy and other diseases of the nervous system: Secondary | ICD-10-CM | POA: Diagnosis not present

## 2020-08-17 DIAGNOSIS — Z888 Allergy status to other drugs, medicaments and biological substances status: Secondary | ICD-10-CM | POA: Diagnosis not present

## 2020-08-17 DIAGNOSIS — Z833 Family history of diabetes mellitus: Secondary | ICD-10-CM | POA: Diagnosis not present

## 2020-08-17 DIAGNOSIS — K219 Gastro-esophageal reflux disease without esophagitis: Secondary | ICD-10-CM | POA: Diagnosis not present

## 2020-08-17 DIAGNOSIS — M8580 Other specified disorders of bone density and structure, unspecified site: Secondary | ICD-10-CM | POA: Insufficient documentation

## 2020-08-17 DIAGNOSIS — M810 Age-related osteoporosis without current pathological fracture: Secondary | ICD-10-CM | POA: Diagnosis not present

## 2020-08-17 HISTORY — PX: VENTRAL HERNIA REPAIR: SHX424

## 2020-08-17 SURGERY — REPAIR, HERNIA, VENTRAL
Anesthesia: General | Site: Abdomen

## 2020-08-17 MED ORDER — DOCUSATE SODIUM 100 MG PO CAPS
200.0000 mg | ORAL_CAPSULE | Freq: Two times a day (BID) | ORAL | Status: DC | PRN
Start: 2020-08-17 — End: 2023-02-04

## 2020-08-17 MED ORDER — BUPIVACAINE LIPOSOME 1.3 % IJ SUSP
INTRAMUSCULAR | Status: DC | PRN
  Administered 2020-08-17: 20 mL

## 2020-08-17 MED ORDER — HYDROCODONE-ACETAMINOPHEN 5-325 MG PO TABS
1.0000 | ORAL_TABLET | Freq: Four times a day (QID) | ORAL | 0 refills | Status: DC | PRN
Start: 2020-08-17 — End: 2023-02-04

## 2020-08-17 MED ORDER — EPHEDRINE SULFATE 50 MG/ML IJ SOLN
INTRAMUSCULAR | Status: DC | PRN
  Administered 2020-08-17: 10 mg via INTRAVENOUS

## 2020-08-17 MED ORDER — SUGAMMADEX SODIUM 200 MG/2ML IV SOLN
INTRAVENOUS | Status: DC | PRN
  Administered 2020-08-17: 200 mg via INTRAVENOUS

## 2020-08-17 MED ORDER — ACETAMINOPHEN 325 MG PO TABS: 325.0000 mg | ORAL_TABLET | ORAL | Status: DC | PRN

## 2020-08-17 MED ORDER — ACETAMINOPHEN 160 MG/5ML PO SOLN
325.0000 mg | ORAL | Status: DC | PRN
  Filled 2020-08-17: qty 20.3

## 2020-08-17 MED ORDER — FENTANYL CITRATE (PF) 100 MCG/2ML IJ SOLN
INTRAMUSCULAR | Status: DC | PRN
  Administered 2020-08-17 (×2): 50 ug via INTRAVENOUS

## 2020-08-17 MED ORDER — DEXAMETHASONE SODIUM PHOSPHATE 10 MG/ML IJ SOLN
INTRAMUSCULAR | Status: DC | PRN
  Administered 2020-08-17: 10 mg via INTRAVENOUS

## 2020-08-17 MED ORDER — PROPOFOL 10 MG/ML IV BOLUS
INTRAVENOUS | Status: DC | PRN
  Administered 2020-08-17: 120 mg via INTRAVENOUS

## 2020-08-17 MED ORDER — BUPIVACAINE HCL (PF) 0.5 % IJ SOLN
INTRAMUSCULAR | Status: AC
  Filled 2020-08-17: qty 30

## 2020-08-17 MED ORDER — PHENYLEPHRINE HCL (PRESSORS) 10 MG/ML IV SOLN
INTRAVENOUS | Status: DC | PRN
  Administered 2020-08-17: 100 ug via INTRAVENOUS

## 2020-08-17 MED ORDER — PROPOFOL 10 MG/ML IV BOLUS
INTRAVENOUS | Status: AC
  Filled 2020-08-17: qty 20

## 2020-08-17 MED ORDER — FENTANYL CITRATE (PF) 100 MCG/2ML IJ SOLN
25.0000 ug | INTRAMUSCULAR | Status: DC | PRN
  Administered 2020-08-17: 25 ug via INTRAVENOUS

## 2020-08-17 MED ORDER — MIDAZOLAM HCL 2 MG/2ML IJ SOLN
INTRAMUSCULAR | Status: AC
  Filled 2020-08-17: qty 2

## 2020-08-17 MED ORDER — PROMETHAZINE HCL 25 MG/ML IJ SOLN: 6.2500 mg | INTRAMUSCULAR | Status: DC | PRN

## 2020-08-17 MED ORDER — BUPIVACAINE LIPOSOME 1.3 % IJ SUSP
INTRAMUSCULAR | Status: AC
  Filled 2020-08-17: qty 20

## 2020-08-17 MED ORDER — ROCURONIUM BROMIDE 100 MG/10ML IV SOLN
INTRAVENOUS | Status: DC | PRN
  Administered 2020-08-17: 50 mg via INTRAVENOUS

## 2020-08-17 MED ORDER — BUPIVACAINE HCL 0.5 % IJ SOLN
INTRAMUSCULAR | Status: DC | PRN
  Administered 2020-08-17: 23 mL
  Administered 2020-08-17: 7 mL

## 2020-08-17 MED ORDER — HYDROCODONE-ACETAMINOPHEN 7.5-325 MG PO TABS
1.0000 | ORAL_TABLET | Freq: Once | ORAL | Status: DC | PRN
  Filled 2020-08-17: qty 1

## 2020-08-17 MED ORDER — FENTANYL CITRATE (PF) 100 MCG/2ML IJ SOLN
INTRAMUSCULAR | Status: AC
  Administered 2020-08-17: 25 ug via INTRAVENOUS
  Filled 2020-08-17: qty 2

## 2020-08-17 MED ORDER — ONDANSETRON HCL 4 MG/2ML IJ SOLN
INTRAMUSCULAR | Status: DC | PRN
  Administered 2020-08-17: 4 mg via INTRAVENOUS

## 2020-08-17 MED ORDER — CEFAZOLIN SODIUM-DEXTROSE 2-4 GM/100ML-% IV SOLN
INTRAVENOUS | Status: AC
  Filled 2020-08-17: qty 100

## 2020-08-17 MED ORDER — CHLORHEXIDINE GLUCONATE 0.12 % MT SOLN
OROMUCOSAL | Status: AC
  Administered 2020-08-17: 15 mL via OROMUCOSAL
  Filled 2020-08-17: qty 15

## 2020-08-17 MED ORDER — LIDOCAINE HCL (CARDIAC) PF 100 MG/5ML IV SOSY
PREFILLED_SYRINGE | INTRAVENOUS | Status: DC | PRN
  Administered 2020-08-17: 100 mg via INTRAVENOUS

## 2020-08-17 MED ORDER — ACETAMINOPHEN 325 MG PO TABS: 650.0000 mg | ORAL_TABLET | Freq: Three times a day (TID) | ORAL | 0 refills | Status: AC | PRN

## 2020-08-17 MED ORDER — FENTANYL CITRATE (PF) 100 MCG/2ML IJ SOLN
INTRAMUSCULAR | Status: AC
  Filled 2020-08-17: qty 2

## 2020-08-17 MED ORDER — CHLORHEXIDINE GLUCONATE CLOTH 2 % EX PADS: 6.0000 | MEDICATED_PAD | Freq: Once | CUTANEOUS | Status: DC

## 2020-08-17 SURGICAL SUPPLY — 33 items
ADH SKN CLS APL DERMABOND .7 (GAUZE/BANDAGES/DRESSINGS) ×1
APL PRP STRL LF DISP 70% ISPRP (MISCELLANEOUS) ×1
BLADE SURG 15 STRL LF DISP TIS (BLADE) ×1 IMPLANT
BLADE SURG 15 STRL SS (BLADE) ×3
CANISTER SUCT 1200ML W/VALVE (MISCELLANEOUS) ×3 IMPLANT
CHLORAPREP W/TINT 26 (MISCELLANEOUS) ×3 IMPLANT
COVER WAND RF STERILE (DRAPES) ×3 IMPLANT
DERMABOND ADVANCED (GAUZE/BANDAGES/DRESSINGS) ×2
DERMABOND ADVANCED .7 DNX12 (GAUZE/BANDAGES/DRESSINGS) ×1 IMPLANT
DRAPE LAPAROTOMY 100X77 ABD (DRAPES) ×3 IMPLANT
ELECT CAUTERY BLADE 6.4 (BLADE) ×3 IMPLANT
ELECT REM PT RETURN 9FT ADLT (ELECTROSURGICAL) ×3
ELECTRODE REM PT RTRN 9FT ADLT (ELECTROSURGICAL) ×1 IMPLANT
GLOVE BIOGEL PI IND STRL 7.0 (GLOVE) ×1 IMPLANT
GLOVE BIOGEL PI INDICATOR 7.0 (GLOVE) ×2
GLOVE SURG SYN 6.5 ES PF (GLOVE) ×3 IMPLANT
GLOVE SURG SYN 6.5 PF PI (GLOVE) ×1 IMPLANT
GOWN STRL REUS W/ TWL LRG LVL3 (GOWN DISPOSABLE) ×2 IMPLANT
GOWN STRL REUS W/TWL LRG LVL3 (GOWN DISPOSABLE) ×6
KIT TURNOVER KIT A (KITS) ×3 IMPLANT
LABEL OR SOLS (LABEL) ×3 IMPLANT
NEEDLE HYPO 22GX1.5 SAFETY (NEEDLE) ×6 IMPLANT
NS IRRIG 500ML POUR BTL (IV SOLUTION) ×3 IMPLANT
PACK BASIN MINOR (MISCELLANEOUS) ×3 IMPLANT
SUT ETHIBOND NAB MO 7 #0 18IN (SUTURE) ×3 IMPLANT
SUT MNCRL 4-0 (SUTURE) ×3
SUT MNCRL 4-0 27XMFL (SUTURE) ×1
SUT VIC AB 3-0 SH 27 (SUTURE) ×6
SUT VIC AB 3-0 SH 27X BRD (SUTURE) ×2 IMPLANT
SUTURE MNCRL 4-0 27XMF (SUTURE) ×1 IMPLANT
SYR 10ML LL (SYRINGE) ×6 IMPLANT
SYR 20ML LL LF (SYRINGE) ×3 IMPLANT
WATER STERILE IRR 1000ML POUR (IV SOLUTION) ×3 IMPLANT

## 2020-08-17 NOTE — Anesthesia Preprocedure Evaluation (Addendum)
Anesthesia Evaluation  Patient identified by MRN, date of birth, ID band Patient awake    Reviewed: Allergy & Precautions, H&P , NPO status , reviewed documented beta blocker date and time   Airway Mallampati: II  TM Distance: >3 FB Neck ROM: limited    Dental  (+) Upper Dentures, Partial Lower, Chipped, Missing   Pulmonary    Pulmonary exam normal        Cardiovascular Normal cardiovascular exam     Neuro/Psych  Neuromuscular disease    GI/Hepatic GERD  Medicated and Controlled,  Endo/Other    Renal/GU      Musculoskeletal  (+) Arthritis ,   Abdominal   Peds  Hematology   Anesthesia Other Findings Past Medical History: No date: Arthritis No date: Carpal tunnel syndrome No date: Cervical disc disease No date: Colon adenomas No date: Fat necrosis No date: GERD (gastroesophageal reflux disease) No date: Inguinal hernia No date: Neutropenia (HCC) No date: Osteopenia No date: Osteoporosis No date: Spondylolisthesis 2021: Ventral hernia Past Surgical History: 1996: ABDOMINAL HYSTERECTOMY 2011: BREAST EXCISIONAL BIOPSY; Left No date: COLONOSCOPY 08/18/2017: COLONOSCOPY WITH PROPOFOL; N/A     Comment:  Procedure: COLONOSCOPY WITH PROPOFOL;  Surgeon: Manya Silvas, MD;  Location: [facility] ENDOSCOPY;  Service:               Endoscopy;  Laterality: N/A; 2020: HERNIA REPAIR; Left 2000: HERNIA REPAIR; Right 11/19/2018: INGUINAL HERNIA REPAIR; Left     Comment:  Procedure: HERNIA REPAIR INGUINAL ADULT;  Surgeon:               Benjamine Sprague, DO;  Location: [facility] ORS;  Service: General;              Laterality: Left; 2009: SHOULDER SURGERY; Right     Comment:  injury repair BMI    Body Mass Index: 28.71 kg/m     Reproductive/Obstetrics                            Anesthesia Physical Anesthesia Plan  ASA: II  Anesthesia Plan: General   Post-op Pain Management:     Induction: Intravenous  PONV Risk Score and Plan: 3 and Ondansetron, Treatment may vary due to age or medical condition and Droperidol  Airway Management Planned: Oral ETT  Additional Equipment:   Intra-op Plan:   Post-operative Plan: Extubation in OR  Informed Consent: I have reviewed the patients History and Physical, chart, labs and discussed the procedure including the risks, benefits and alternatives for the proposed anesthesia with the patient or authorized representative who has indicated his/her understanding and acceptance.     Dental Advisory Given  Plan Discussed with: CRNA  Anesthesia Plan Comments:         Anesthesia Quick Evaluation

## 2020-08-17 NOTE — Interval H&P Note (Signed)
History and Physical Interval Note:  08/17/2020 9:07 AM  Rebecca Salinas  has presented today for surgery, with the diagnosis of K43.6 ventral hernia.  The various methods of treatment have been discussed with the patient and family. After consideration of risks, benefits and other options for treatment, the patient has consented to  Procedure(s): HERNIA REPAIR VENTRAL ADULT (N/A) as a surgical intervention.  The patient's history has been reviewed, patient examined, no change in status, stable for surgery.  I have reviewed the patient's chart and labs.  Questions were answered to the patient's satisfaction.     Aasim Restivo Lysle Pearl

## 2020-08-17 NOTE — Transfer of Care (Signed)
Immediate Anesthesia Transfer of Care Note  Patient: Rebecca Salinas  Procedure(s) Performed: HERNIA REPAIR VENTRAL ADULT (N/A Abdomen)  Patient Location: PACU  Anesthesia Type:General  Level of Consciousness: sedated  Airway & Oxygen Therapy: Patient Spontanous Breathing and Patient connected to face mask oxygen  Post-op Assessment: Report given to RN and Post -op Vital signs reviewed and stable  Post vital signs: Reviewed and stable  Last Vitals:  Vitals Value Taken Time  BP 136/88 08/17/20 1205  Temp 36.1 C 08/17/20 1205  Pulse 71 08/17/20 1205  Resp 13 08/17/20 1205  SpO2 100 % 08/17/20 1204  Vitals shown include unvalidated device data.  Last Pain:  Vitals:   08/17/20 0800  TempSrc: Oral  PainSc: 0-No pain         Complications: No complications documented.

## 2020-08-17 NOTE — Discharge Instructions (Addendum)
Hernia repair, Care After This sheet gives you information about how to care for yourself after your procedure. Your health care provider may also give you more specific instructions. If you have problems or questions, contact your health care provider. What can I expect after the procedure? After your procedure, it is common to have the following:  Pain in your abdomen, especially in the incision areas. You will be given medicine to control the pain.  Tiredness. This is a normal part of the recovery process. Your energy level will return to normal over the next several weeks.  Changes in your bowel movements, such as constipation or needing to go more often. Talk with your health care provider about how to manage this. Follow these instructions at home: Medicines   tylenol  as needed for discomfort.  Use narcotics, if prescribed, only when tylenol and motrin is not enough to control pain.   325-650mg  every 8hrs to max of 3000mg /24hrs (including the 325mg  in every norco dose) for the tylenol.    PLEASE RECORD NUMBER OF PILLS TAKEN UNTIL NEXT FOLLOW UP APPT.  THIS WILL HELP DETERMINE HOW READY YOU ARE TO BE RELEASED FROM ANY ACTIVITY RESTRICTIONS  Do not drive or use heavy machinery while taking prescription pain medicine.  Do not drink alcohol while taking prescription pain medicine.  Incision care     Follow instructions from your health care provider about how to take care of your incision areas. Make sure you: ? Keep your incisions clean and dry. ? Wash your hands with soap and water before and after applying medicine to the areas, and before and after changing your bandage (dressing). If soap and water are not available, use hand sanitizer. ? Change your dressing as told by your health care provider. ? Leave stitches (sutures), skin glue, or adhesive strips in place. These skin closures may need to stay in place for 2 weeks or longer. If adhesive strip edges start to loosen and  curl up, you may trim the loose edges. Do not remove adhesive strips completely unless your health care provider tells you to do that.  Do not wear tight clothing over the incisions. Tight clothing may rub and irritate the incision areas, which may cause the incisions to open.  Do not take baths, swim, or use a hot tub until your health care provider approves. OK TO SHOWER IN 24HRS.    Check your incision area every day for signs of infection. Check for: ? More redness, swelling, or pain. ? More fluid or blood. ? Warmth. ? Pus or a bad smell. Activity  Avoid lifting anything that is heavier than 10 lb (4.5 kg) for 2 weeks or until your health care provider says it is okay.  No pushing/pulling greater than 30lbs  You may resume normal activities as told by your health care provider. Ask your health care provider what activities are safe for you.  Take rest breaks during the day as needed. Eating and drinking  Follow instructions from your health care provider about what you can eat after surgery.  To prevent or treat constipation while you are taking prescription pain medicine, your health care provider may recommend that you: ? Drink enough fluid to keep your urine clear or pale yellow. ? Take over-the-counter or prescription medicines. ? Eat foods that are high in fiber, such as fresh fruits and vegetables, whole grains, and beans. ? Limit foods that are high in fat and processed sugars, such as fried and sweet  foods. General instructions  Ask your health care provider when you will need an appointment to get your sutures or staples removed.  Keep all follow-up visits as told by your health care provider. This is important. Contact a health care provider if:  You have more redness, swelling, or pain around your incisions.  You have more fluid or blood coming from the incisions.  Your incisions feel warm to the touch.  You have pus or a bad smell coming from your incisions  or your dressing.  You have a fever.  You have an incision that breaks open (edges not staying together) after sutures or staples have been removed. Get help right away if:  You develop a rash.  You have chest pain or difficulty breathing.  You have pain or swelling in your legs.  You feel light-headed or you faint.  Your abdomen swells (becomes distended).  You have nausea or vomiting.  You have blood in your stool (feces). This information is not intended to replace advice given to you by your health care provider. Make sure you discuss any questions you have with your health care provider. Document Released: 05/03/2005 Document Revised: 07/03/2018 Document Reviewed: 07/15/2016 Elsevier Interactive Patient Education  2019 [place].   Bupivacaine Liposomal Suspension for Injection What is this medicine? BUPIVACAINE LIPOSOMAL (bue PIV a kane LIP oh som al) is an anesthetic. It causes loss of feeling in the skin or other tissues. It is used to prevent and to treat pain from some procedures. This medicine may be used for other purposes; ask your health care provider or pharmacist if you have questions. COMMON BRAND NAME(S): EXPAREL What should I tell my health care provider before I take this medicine? They need to know if you have any of these conditions:  G6PD deficiency  heart disease  kidney disease  liver disease  low blood pressure  lung or breathing disease, like asthma  an unusual or allergic reaction to bupivacaine, other medicines, foods, dyes, or preservatives  pregnant or trying to get pregnant  breast-feeding How should I use this medicine? This medicine is for injection into the affected area. It is given by a health care professional in a hospital or clinic setting. Talk to your pediatrician regarding the use of this medicine in children. Special care may be needed. Overdosage: If you think you have taken too much of this medicine contact a poison  control center or emergency room at once. NOTE: This medicine is only for you. Do not share this medicine with others. What if I miss a dose? This does not apply. What may interact with this medicine? This medicine may interact with the following medications:  acetaminophen  certain antibiotics like dapsone, nitrofurantoin, aminosalicylic acid, sulfonamides  certain medicines for seizures like phenobarbital, phenytoin, valproic acid  chloroquine  cyclophosphamide  flutamide  hydroxyurea  ifosfamide  metoclopramide  nitric oxide  nitroglycerin  nitroprusside  nitrous oxide  other local anesthetics like lidocaine, pramoxine, tetracaine  primaquine  quinine  rasburicase  sulfasalazine This list may not describe all possible interactions. Give your health care provider a list of all the medicines, herbs, non-prescription drugs, or dietary supplements you use. Also tell them if you smoke, drink alcohol, or use illegal drugs. Some items may interact with your medicine. What should I watch for while using this medicine? Your condition will be monitored carefully while you are receiving this medicine. Be careful to avoid injury while the area is numb, and you are not  aware of pain. What side effects may I notice from receiving this medicine? Side effects that you should report to your doctor or health care professional as soon as possible:  allergic reactions like skin rash, itching or hives, swelling of the face, lips, or tongue  seizures  signs and symptoms of a dangerous change in heartbeat or heart rhythm like chest pain; dizziness; fast, irregular heartbeat; palpitations; feeling faint or lightheaded; falls; breathing problems  signs and symptoms of methemoglobinemia such as pale, gray, or blue colored skin; headache; fast heartbeat; shortness of breath; feeling faint or lightheaded, falls; tiredness Side effects that usually do not require medical attention  (report to your doctor or health care professional if they continue or are bothersome):  anxious  back pain  changes in taste  changes in vision  constipation  dizziness  fever  nausea, vomiting This list may not describe all possible side effects. Call your doctor for medical advice about side effects. You may report side effects to FDA at 1-800-FDA-1088. Where should I keep my medicine? This drug is given in a hospital or clinic and will not be stored at home. NOTE: This sheet is a summary. It may not cover all possible information. If you have questions about this medicine, talk to your doctor, pharmacist, or health care provider.  2020 Elsevier/Gold Standard (2019-07-27 10:48:23)   AMBULATORY SURGERY  DISCHARGE INSTRUCTIONS   1) The drugs that you were given will stay in your system until tomorrow so for the next 24 hours you should not:  A) Drive an automobile B) Make any legal decisions C) Drink any alcoholic beverage   2) You may resume regular meals tomorrow.  Today it is better to start with liquids and gradually work up to solid foods.  You may eat anything you prefer, but it is better to start with liquids, then soup and crackers, and gradually work up to solid foods.   3) Please notify your doctor immediately if you have any unusual bleeding, trouble breathing, redness and pain at the surgery site, drainage, fever, or pain not relieved by medication.    4) Additional Instructions:        Please contact your physician with any problems or Same Day Surgery at [phone], Monday through Friday 6 am to 4 pm, or [place] at [facility] number at [phone].

## 2020-08-17 NOTE — Op Note (Signed)
Preoperative diagnosis: Incarcerated ventral hernia postoperative diagnosis: same  Procedure:  Open ventral hernia repair Anesthesia: GETA Surgeon: Benjamine Sprague  Wound Classification: Clean  Specimen: none  Complications: None  Estimated Blood Loss: minimal  Indications:see HPI  Findings: 1. 1 cm x 1.2 cm incarcerated ventral hernia 2. Tension free repair achieved with suture 3. Adequate hemostasis  Description of procedure: The patient was brought to the operating room and general anesthesia was induced. A time-out was completed verifying correct patient, procedure, site, positioning, and implant(s) and/or special equipment prior to beginning this procedure. Antibiotics were administered prior to making the incision. SCDs placed. The anterior abdominal wall was prepped and draped in the standard sterile fashion.   A midline incision was made after infusing the preplanned incision with half percent Marcaine.  Dissection carried down to fascia where the ventral hernia was noted.  After contents were dissected away from surrounding tissue and all reduced back deep into fascia, a 1 cm x 1.2 cm fascial defect was noted.  Hemostasis was confirmed prior to reducing the actual contents.  The defect itself was primary closed using 0 Ethibond in an interrupted fashion.  The fascia as well as the skin incision was then infused with exparel.  After confirming hemostasis, wound was irrigated and closed in a multilayer fashion, using 3-0 Vicryl for the deep dermal layer in an interrupted fashion and running 4-0 Monocryl in a subcuticular fashion.  Wound was then dressed with Dermabond.  Patient was then successfully awakened and transferred to PACU in stable condition.  At the end of the procedure sponge and instrument counts were correct

## 2020-08-17 NOTE — Progress Notes (Signed)
   08/17/20 0755  Clinical Encounter Type  Visited With Patient;Family  Visit Type Initial  Referral From Chaplain  Consult/Referral To Chaplain  While rounding [facility] waiting area, chaplain briefly visited with Pt and son. As chaplain was talking to them, staff called Pt to the back. Chaplain checked on the son, who said he was doing fine. Chaplain wished him well and left.

## 2020-08-18 ENCOUNTER — Encounter: Payer: Self-pay | Admitting: Surgery

## 2020-08-24 DIAGNOSIS — K436 Other and unspecified ventral hernia with obstruction, without gangrene: Secondary | ICD-10-CM | POA: Diagnosis not present

## 2020-08-24 DIAGNOSIS — M810 Age-related osteoporosis without current pathological fracture: Secondary | ICD-10-CM | POA: Diagnosis not present

## 2020-08-24 DIAGNOSIS — Z7689 Persons encountering health services in other specified circumstances: Secondary | ICD-10-CM | POA: Diagnosis not present

## 2020-08-24 DIAGNOSIS — K219 Gastro-esophageal reflux disease without esophagitis: Secondary | ICD-10-CM | POA: Diagnosis not present

## 2020-08-24 NOTE — Anesthesia Postprocedure Evaluation (Signed)
Anesthesia Post Note  Patient: Rebecca Salinas  Procedure(s) Performed: HERNIA REPAIR VENTRAL ADULT (N/A Abdomen)  Patient location during evaluation: PACU Anesthesia Type: General Level of consciousness: awake and alert Pain management: pain level controlled Vital Signs Assessment: post-procedure vital signs reviewed and stable Respiratory status: spontaneous breathing, nonlabored ventilation, respiratory function stable and patient connected to nasal cannula oxygen Cardiovascular status: blood pressure returned to baseline and stable Postop Assessment: no apparent nausea or vomiting Anesthetic complications: no   No complications documented.   Last Vitals:  Vitals:   08/17/20 1235 08/17/20 1248  BP: (!) 153/82 (!) 152/71  Pulse:  73  Resp:  16  Temp:  (!) 36.3 C  SpO2:  97%    Last Pain:  Vitals:   08/18/20 0827  TempSrc:   PainSc: 0-No pain                 Alphonsus Sias

## 2020-10-09 ENCOUNTER — Ambulatory Visit
Admission: RE | Admit: 2020-10-09 | Discharge: 2020-10-09 | Disposition: A | Discharge status: Home / Self Care | Payer: Medicare Other | Source: Ambulatory Visit | Attending: Obstetrics and Gynecology | Admitting: Obstetrics and Gynecology

## 2020-10-09 ENCOUNTER — Other Ambulatory Visit: Payer: Self-pay

## 2020-10-09 DIAGNOSIS — Z1231 Encounter for screening mammogram for malignant neoplasm of breast: Secondary | ICD-10-CM | POA: Insufficient documentation

## 2020-11-27 DIAGNOSIS — Z780 Asymptomatic menopausal state: Secondary | ICD-10-CM | POA: Diagnosis not present

## 2020-11-27 DIAGNOSIS — K219 Gastro-esophageal reflux disease without esophagitis: Secondary | ICD-10-CM | POA: Diagnosis not present

## 2020-11-27 DIAGNOSIS — Z1329 Encounter for screening for other suspected endocrine disorder: Secondary | ICD-10-CM | POA: Diagnosis not present

## 2020-11-27 DIAGNOSIS — Z1159 Encounter for screening for other viral diseases: Secondary | ICD-10-CM | POA: Diagnosis not present

## 2020-11-27 DIAGNOSIS — Z131 Encounter for screening for diabetes mellitus: Secondary | ICD-10-CM | POA: Diagnosis not present

## 2020-11-27 DIAGNOSIS — Z8719 Personal history of other diseases of the digestive system: Secondary | ICD-10-CM | POA: Diagnosis not present

## 2020-11-27 DIAGNOSIS — M810 Age-related osteoporosis without current pathological fracture: Secondary | ICD-10-CM | POA: Diagnosis not present

## 2021-01-03 DIAGNOSIS — H40053 Ocular hypertension, bilateral: Secondary | ICD-10-CM | POA: Diagnosis not present

## 2021-03-21 DIAGNOSIS — M810 Age-related osteoporosis without current pathological fracture: Secondary | ICD-10-CM | POA: Diagnosis not present

## 2021-03-21 DIAGNOSIS — Z780 Asymptomatic menopausal state: Secondary | ICD-10-CM | POA: Diagnosis not present

## 2021-03-21 DIAGNOSIS — K219 Gastro-esophageal reflux disease without esophagitis: Secondary | ICD-10-CM | POA: Diagnosis not present

## 2021-03-21 DIAGNOSIS — Z131 Encounter for screening for diabetes mellitus: Secondary | ICD-10-CM | POA: Diagnosis not present

## 2021-03-21 DIAGNOSIS — Z1159 Encounter for screening for other viral diseases: Secondary | ICD-10-CM | POA: Diagnosis not present

## 2021-03-21 DIAGNOSIS — Z1329 Encounter for screening for other suspected endocrine disorder: Secondary | ICD-10-CM | POA: Diagnosis not present

## 2021-03-29 DIAGNOSIS — Z8739 Personal history of other diseases of the musculoskeletal system and connective tissue: Secondary | ICD-10-CM | POA: Diagnosis not present

## 2021-03-29 DIAGNOSIS — Z1211 Encounter for screening for malignant neoplasm of colon: Secondary | ICD-10-CM | POA: Diagnosis not present

## 2021-03-29 DIAGNOSIS — Z1231 Encounter for screening mammogram for malignant neoplasm of breast: Secondary | ICD-10-CM | POA: Diagnosis not present

## 2021-04-18 DIAGNOSIS — M810 Age-related osteoporosis without current pathological fracture: Secondary | ICD-10-CM | POA: Diagnosis not present

## 2021-05-07 DIAGNOSIS — I10 Essential (primary) hypertension: Secondary | ICD-10-CM | POA: Diagnosis not present

## 2021-05-07 DIAGNOSIS — K219 Gastro-esophageal reflux disease without esophagitis: Secondary | ICD-10-CM | POA: Diagnosis not present

## 2021-05-07 DIAGNOSIS — Z1389 Encounter for screening for other disorder: Secondary | ICD-10-CM | POA: Diagnosis not present

## 2021-05-07 DIAGNOSIS — M810 Age-related osteoporosis without current pathological fracture: Secondary | ICD-10-CM | POA: Diagnosis not present

## 2021-05-07 DIAGNOSIS — Z0000 Encounter for general adult medical examination without abnormal findings: Secondary | ICD-10-CM | POA: Diagnosis not present

## 2021-06-07 DIAGNOSIS — M47812 Spondylosis without myelopathy or radiculopathy, cervical region: Secondary | ICD-10-CM | POA: Diagnosis not present

## 2021-06-07 DIAGNOSIS — M50223 Other cervical disc displacement at C6-C7 level: Secondary | ICD-10-CM | POA: Diagnosis not present

## 2021-06-07 DIAGNOSIS — R110 Nausea: Secondary | ICD-10-CM | POA: Diagnosis not present

## 2021-06-07 DIAGNOSIS — M2578 Osteophyte, vertebrae: Secondary | ICD-10-CM | POA: Diagnosis not present

## 2021-06-07 DIAGNOSIS — R0902 Hypoxemia: Secondary | ICD-10-CM | POA: Diagnosis not present

## 2021-06-07 DIAGNOSIS — S090XXA Injury of blood vessels of head, not elsewhere classified, initial encounter: Secondary | ICD-10-CM | POA: Diagnosis not present

## 2021-06-07 DIAGNOSIS — Z043 Encounter for examination and observation following other accident: Secondary | ICD-10-CM | POA: Diagnosis not present

## 2021-06-07 DIAGNOSIS — M5030 Other cervical disc degeneration, unspecified cervical region: Secondary | ICD-10-CM | POA: Diagnosis not present

## 2021-06-07 DIAGNOSIS — Z743 Need for continuous supervision: Secondary | ICD-10-CM | POA: Diagnosis not present

## 2021-06-07 DIAGNOSIS — R42 Dizziness and giddiness: Secondary | ICD-10-CM | POA: Diagnosis not present

## 2021-06-07 DIAGNOSIS — S0990XA Unspecified injury of head, initial encounter: Secondary | ICD-10-CM | POA: Diagnosis not present

## 2021-06-07 DIAGNOSIS — R404 Transient alteration of awareness: Secondary | ICD-10-CM | POA: Diagnosis not present

## 2021-06-07 DIAGNOSIS — R519 Headache, unspecified: Secondary | ICD-10-CM | POA: Diagnosis not present

## 2021-06-07 DIAGNOSIS — S199XXA Unspecified injury of neck, initial encounter: Secondary | ICD-10-CM | POA: Diagnosis not present

## 2021-06-07 DIAGNOSIS — W19XXXA Unspecified fall, initial encounter: Secondary | ICD-10-CM | POA: Diagnosis not present

## 2021-06-07 DIAGNOSIS — M50222 Other cervical disc displacement at C5-C6 level: Secondary | ICD-10-CM | POA: Diagnosis not present

## 2021-06-14 ENCOUNTER — Other Ambulatory Visit: Payer: Self-pay | Admitting: Obstetrics and Gynecology

## 2021-06-14 DIAGNOSIS — Z1231 Encounter for screening mammogram for malignant neoplasm of breast: Secondary | ICD-10-CM

## 2021-07-06 DIAGNOSIS — H40051 Ocular hypertension, right eye: Secondary | ICD-10-CM | POA: Diagnosis not present

## 2021-07-18 DIAGNOSIS — H401121 Primary open-angle glaucoma, left eye, mild stage: Secondary | ICD-10-CM | POA: Diagnosis not present

## 2021-10-11 ENCOUNTER — Ambulatory Visit
Admission: RE | Admit: 2021-10-11 | Discharge: 2021-10-11 | Disposition: A | Discharge status: Home / Self Care | Payer: Medicare Other | Source: Ambulatory Visit | Attending: Obstetrics and Gynecology | Admitting: Obstetrics and Gynecology

## 2021-10-11 ENCOUNTER — Other Ambulatory Visit: Payer: Self-pay

## 2021-10-11 DIAGNOSIS — Z1231 Encounter for screening mammogram for malignant neoplasm of breast: Secondary | ICD-10-CM | POA: Diagnosis present

## 2022-09-05 ENCOUNTER — Other Ambulatory Visit: Payer: Self-pay | Admitting: Obstetrics and Gynecology

## 2022-09-05 DIAGNOSIS — Z1231 Encounter for screening mammogram for malignant neoplasm of breast: Secondary | ICD-10-CM

## 2022-09-11 ENCOUNTER — Ambulatory Visit
Admission: RE | Admit: 2022-09-11 | Discharge: 2022-09-11 | Disposition: A | Discharge status: Home / Self Care | Payer: Medicare Other | Attending: Internal Medicine | Admitting: Internal Medicine

## 2022-09-11 ENCOUNTER — Encounter: Payer: Self-pay | Admitting: Internal Medicine

## 2022-09-11 ENCOUNTER — Ambulatory Visit: Payer: Medicare Other | Admitting: Anesthesiology

## 2022-09-11 ENCOUNTER — Encounter
Admission: RE | Disposition: A | Discharge status: Home / Self Care | Payer: Self-pay | Source: Home / Self Care | Attending: Internal Medicine

## 2022-09-11 DIAGNOSIS — Z1211 Encounter for screening for malignant neoplasm of colon: Secondary | ICD-10-CM | POA: Diagnosis present

## 2022-09-11 DIAGNOSIS — Z86010 Personal history of colonic polyps: Secondary | ICD-10-CM | POA: Diagnosis not present

## 2022-09-11 DIAGNOSIS — Z79899 Other long term (current) drug therapy: Secondary | ICD-10-CM | POA: Insufficient documentation

## 2022-09-11 DIAGNOSIS — K219 Gastro-esophageal reflux disease without esophagitis: Secondary | ICD-10-CM | POA: Insufficient documentation

## 2022-09-11 DIAGNOSIS — M1990 Unspecified osteoarthritis, unspecified site: Secondary | ICD-10-CM | POA: Insufficient documentation

## 2022-09-11 HISTORY — PX: COLONOSCOPY WITH PROPOFOL: SHX5780

## 2022-09-11 HISTORY — DX: Unspecified glaucoma: H40.9

## 2022-09-11 SURGERY — COLONOSCOPY WITH PROPOFOL
Anesthesia: General

## 2022-09-11 MED ORDER — SODIUM CHLORIDE 0.9 % IV SOLN: INTRAVENOUS | Status: DC

## 2022-09-11 MED ORDER — PROPOFOL 500 MG/50ML IV EMUL
INTRAVENOUS | Status: DC | PRN
  Administered 2022-09-11: 100 ug/kg/min via INTRAVENOUS

## 2022-09-11 MED ORDER — PROPOFOL 10 MG/ML IV BOLUS
INTRAVENOUS | Status: DC | PRN
  Administered 2022-09-11: 30 mg via INTRAVENOUS

## 2022-09-11 NOTE — H&P (Signed)
  Outpatient short stay form Pre-procedure 09/11/2022 11:36 AM Zorawar Strollo K. Alice Reichert, M.D.  Primary Physician: Laverta Baltimore, M.D.  Reason for visit:  History of adenomatous colon polyps  History of present illness:  Patient is here to discuss colonoscopy. Last colonoscopy 07/2017. States she has pain at times on the left - she has a mesh from hernia repair.  LAST- 08/18/2017: Colonoscopy: Indication personal history of colon polyps impression: To 3 diminutive polyps, internal hemorrhoid. Pathology returned tubular adenoma x1, hyperplastic polyp x2. Repeat colonoscopy 2023.   No current facility-administered medications for this encounter.  Medications Prior to Admission  Medication Sig Dispense Refill Last Dose   Cetirizine HCl (ZYRTEC ALLERGY) 10 MG CAPS Take 1 capsule by mouth daily.      docusate sodium (COLACE) 100 MG capsule Take 2 capsules (200 mg total) by mouth 2 (two) times daily as needed.      esomeprazole (NEXIUM) 40 MG capsule Take 40 mg by mouth every morning.       HYDROcodone-acetaminophen (NORCO) 5-325 MG tablet Take 1 tablet by mouth every 6 (six) hours as needed for up to 6 doses for moderate pain. 6 tablet 0    vitamin C (ASCORBIC ACID) 500 MG tablet Take 500 mg by mouth as needed.         Allergies  Allergen Reactions   Norflex [Orphenadrine Citrate]     Lightheaded    Lodine [Etodolac] Itching and Rash     Past Medical History:  Diagnosis Date   Arthritis    Carpal tunnel syndrome    Cervical disc disease    Colon adenomas    Fat necrosis    GERD (gastroesophageal reflux disease)    Inguinal hernia    Neutropenia (HCC)    Osteopenia    Osteoporosis    Spondylolisthesis    Ventral hernia 2021    Review of systems:  Otherwise negative.    Physical Exam  Gen: Alert, oriented. Appears stated age.  HEENT: [place]/AT. PERRLA. Lungs: CTA, no wheezes. CV: RR nl S1, S2. Abd: soft, benign, no masses. BS+ Ext: No edema. Pulses 2+    Planned  procedures: Proceed with colonoscopy. The patient understands the nature of the planned procedure, indications, risks, alternatives and potential complications including but not limited to bleeding, infection, perforation, damage to internal organs and possible oversedation/side effects from anesthesia. The patient agrees and gives consent to proceed.  Please refer to procedure notes for findings, recommendations and patient disposition/instructions.     Herrick Hartog K. Alice Reichert, M.D. Gastroenterology 09/11/2022  11:36 AM

## 2022-09-11 NOTE — Interval H&P Note (Signed)
History and Physical Interval Note:  09/11/2022 11:39 AM  Rebecca Salinas  has presented today for surgery, with the diagnosis of Z86.010  - Hx of adenomatous colonic polyps.  The various methods of treatment have been discussed with the patient and family. After consideration of risks, benefits and other options for treatment, the patient has consented to  Procedure(s): COLONOSCOPY WITH PROPOFOL (N/A) as a surgical intervention.  The patient's history has been reviewed, patient examined, no change in status, stable for surgery.  I have reviewed the patient's chart and labs.  Questions were answered to the patient's satisfaction.     [place], [place]

## 2022-09-11 NOTE — Anesthesia Preprocedure Evaluation (Addendum)
Anesthesia Evaluation  Patient identified by MRN, date of birth, ID band Patient awake    Reviewed: Allergy & Precautions, H&P , NPO status , reviewed documented beta blocker date and time   Airway Mallampati: II  TM Distance: >3 FB Neck ROM: limited    Dental  (+) Partial Lower, Chipped, Missing, Upper Dentures   Pulmonary    Pulmonary exam normal        Cardiovascular Normal cardiovascular exam     Neuro/Psych  Neuromuscular disease    GI/Hepatic ,GERD  Medicated and Controlled,,  Endo/Other    Renal/GU      Musculoskeletal  (+) Arthritis ,    Abdominal   Peds  Hematology   Anesthesia Other Findings Past Medical History: No date: Arthritis No date: Carpal tunnel syndrome No date: Cervical disc disease No date: Colon adenomas No date: Fat necrosis No date: GERD (gastroesophageal reflux disease) No date: Inguinal hernia No date: Neutropenia (HCC) No date: Osteopenia No date: Osteoporosis No date: Spondylolisthesis 2021: Ventral hernia Past Surgical History: 1996: ABDOMINAL HYSTERECTOMY 2011: BREAST EXCISIONAL BIOPSY; Left No date: COLONOSCOPY 08/18/2017: COLONOSCOPY WITH PROPOFOL; N/A     Comment:  Procedure: COLONOSCOPY WITH PROPOFOL;  Surgeon: Manya Silvas, MD;  Location: [facility] ENDOSCOPY;  Service:               Endoscopy;  Laterality: N/A; 2020: HERNIA REPAIR; Left 2000: HERNIA REPAIR; Right 11/19/2018: INGUINAL HERNIA REPAIR; Left     Comment:  Procedure: HERNIA REPAIR INGUINAL ADULT;  Surgeon:               Benjamine Sprague, DO;  Location: [facility] ORS;  Service: General;              Laterality: Left; 2009: SHOULDER SURGERY; Right     Comment:  injury repair BMI    Body Mass Index: 28.71 kg/m     Reproductive/Obstetrics                             Anesthesia Physical Anesthesia Plan  ASA: II  Anesthesia Plan: General   Post-op Pain Management:     Induction: Intravenous  PONV Risk Score and Plan: 3 and Treatment may vary due to age or medical condition, Propofol infusion and TIVA  Airway Management Planned: Natural Airway  Additional Equipment:   Intra-op Plan:   Post-operative Plan:   Informed Consent: I have reviewed the patients History and Physical, chart, labs and discussed the procedure including the risks, benefits and alternatives for the proposed anesthesia with the patient or authorized representative who has indicated his/her understanding and acceptance.     Dental Advisory Given  Plan Discussed with: CRNA  Anesthesia Plan Comments:         Anesthesia Quick Evaluation

## 2022-09-11 NOTE — Op Note (Signed)
[facility] Gastroenterology Patient Name: Rebecca Salinas Procedure Date: 09/11/2022 11:33 AM MRN: [id number] Account #: [id number] Date of Birth: 1946-07-15 Admit Type: Outpatient Age: 76 Room: [facility] ENDO ROOM 2 Gender: Female Note Status: Finalized Instrument Name: Park Meo [id number] Procedure:             Colonoscopy Indications:           Surveillance: Personal history of adenomatous polyps                         on last colonoscopy > 5 years ago Providers:             Lorie Apley K. Alice Reichert MD, MD Referring MD:          Benay Pike. Alice Reichert MD, MD (Referring MD), No Local Md,                         MD (Referring MD) Medicines:             Propofol per Anesthesia Complications:         No immediate complications. Procedure:             Pre-Anesthesia Assessment:                        - The risks and benefits of the procedure and the                         sedation options and risks were discussed with the                         patient. All questions were answered and informed                         consent was obtained.                        - Patient identification and proposed procedure were                         verified prior to the procedure by the nurse. The                         procedure was verified in the procedure room.                        - ASA Grade Assessment: III - A patient with severe                         systemic disease.                        - After reviewing the risks and benefits, the patient                         was deemed in satisfactory condition to undergo the                         procedure.  After obtaining informed consent, the colonoscope was                         passed under direct vision. Throughout the procedure,                         the patient's blood pressure, pulse, and oxygen                         saturations were monitored continuously. The                         Colonoscope  was introduced through the anus and                         advanced to the the cecum, identified by appendiceal                         orifice and ileocecal valve. The colonoscopy was                         performed without difficulty. The patient tolerated                         the procedure well. The quality of the bowel                         preparation was good. The ileocecal valve, appendiceal                         orifice, and rectum were photographed. Findings:      The perianal and digital rectal examinations were normal. Pertinent       negatives include normal sphincter tone and no palpable rectal lesions.      The entire examined colon appeared normal on direct and retroflexion       views. Impression:            - The entire examined colon is normal on direct and                         retroflexion views.                        - No specimens collected. Recommendation:        - Patient has a contact number available for                         emergencies. The signs and symptoms of potential                         delayed complications were discussed with the patient.                         Return to normal activities tomorrow. Written                         discharge instructions were provided to the patient.                        -  Resume previous diet.                        - Continue present medications.                        - No repeat colonoscopy due to current age (69 years                         or older) and the absence of colonic polyps.                        - You do NOT require further colon cancer screening                         measures (Annual stool testing (i.e. hemoccult, FIT,                         cologuard), sigmoidoscopy, colonoscopy or CT                         colonography). You should share this recommendation                         with your Primary Care provider.                        - Return to GI clinic PRN.                         - The findings and recommendations were discussed with                         the patient. Procedure Code(s):     --- Professional ---                        S9628, Colorectal cancer screening; colonoscopy on                         individual at high risk Diagnosis Code(s):     --- Professional ---                        Z86.010, Personal history of colonic polyps CPT copyright 2022 American Medical Association. All rights reserved. The codes documented in this report are preliminary and upon coder review may  be revised to meet current compliance requirements. Efrain Sella MD, MD 09/11/2022 12:41:31 PM This report has been signed electronically. Number of Addenda: 0 Note Initiated On: 09/11/2022 11:33 AM Scope Withdrawal Time: 0 hours 5 minutes 46 seconds  Total Procedure Duration: 0 hours 9 minutes 32 seconds  Estimated Blood Loss:  Estimated blood loss: none.      [facility]

## 2022-09-11 NOTE — Transfer of Care (Signed)
Immediate Anesthesia Transfer of Care Note  Patient: Rebecca Salinas  Procedure(s) Performed: COLONOSCOPY WITH PROPOFOL  Patient Location: PACU  Anesthesia Type:General  Level of Consciousness: awake, alert , and oriented  Airway & Oxygen Therapy: Patient Spontanous Breathing and Patient connected to nasal cannula oxygen  Post-op Assessment: Report given to RN and Post -op Vital signs reviewed and stable  Post vital signs: Reviewed and stable  Last Vitals:  Vitals Value Taken Time  BP 107/73 09/11/22 1245  Temp 35.6 C 09/11/22 1245  Pulse 86 09/11/22 1246  Resp 22 09/11/22 1246  SpO2 100 % 09/11/22 1246  Vitals shown include unvalidated device data.  Last Pain:  Vitals:   09/11/22 1245  TempSrc: Temporal         Complications: No notable events documented.

## 2022-09-12 ENCOUNTER — Encounter: Payer: Self-pay | Admitting: Internal Medicine

## 2022-09-12 NOTE — Anesthesia Postprocedure Evaluation (Signed)
Anesthesia Post Note  Patient: Rebecca Salinas  Procedure(s) Performed: COLONOSCOPY WITH PROPOFOL  Patient location during evaluation: PACU Anesthesia Type: General Level of consciousness: awake and alert Pain management: pain level controlled Vital Signs Assessment: post-procedure vital signs reviewed and stable Respiratory status: spontaneous breathing, nonlabored ventilation and respiratory function stable Cardiovascular status: blood pressure returned to baseline and stable Postop Assessment: no apparent nausea or vomiting Anesthetic complications: no   No notable events documented.   Last Vitals:  Vitals:   09/11/22 1245 09/11/22 1255  BP: 107/73   Pulse:    Resp:    Temp: (!) 35.6 C   SpO2:  100%    Last Pain:  Vitals:   09/12/22 0744  TempSrc:   PainSc: 0-No pain                 [place] Ouch

## 2022-10-14 ENCOUNTER — Ambulatory Visit
Admission: RE | Admit: 2022-10-14 | Discharge: 2022-10-14 | Disposition: A | Discharge status: Home / Self Care | Payer: Medicare Other | Source: Ambulatory Visit | Attending: Obstetrics and Gynecology | Admitting: Obstetrics and Gynecology

## 2022-10-14 DIAGNOSIS — Z1231 Encounter for screening mammogram for malignant neoplasm of breast: Secondary | ICD-10-CM | POA: Insufficient documentation

## 2023-02-04 ENCOUNTER — Encounter: Payer: Self-pay | Admitting: Ophthalmology

## 2023-02-06 NOTE — Discharge Instructions (Signed)

## 2023-02-11 ENCOUNTER — Ambulatory Visit: Payer: Medicare Other | Admitting: Anesthesiology

## 2023-02-11 ENCOUNTER — Other Ambulatory Visit: Payer: Self-pay

## 2023-02-11 ENCOUNTER — Ambulatory Visit
Admission: RE | Admit: 2023-02-11 | Discharge: 2023-02-11 | Disposition: A | Discharge status: Home / Self Care | Payer: Medicare Other | Attending: Ophthalmology | Admitting: Ophthalmology

## 2023-02-11 ENCOUNTER — Encounter: Payer: Self-pay | Admitting: Ophthalmology

## 2023-02-11 ENCOUNTER — Encounter
Admission: RE | Disposition: A | Discharge status: Home / Self Care | Payer: Self-pay | Source: Home / Self Care | Attending: Ophthalmology

## 2023-02-11 DIAGNOSIS — Z79899 Other long term (current) drug therapy: Secondary | ICD-10-CM | POA: Diagnosis not present

## 2023-02-11 DIAGNOSIS — K219 Gastro-esophageal reflux disease without esophagitis: Secondary | ICD-10-CM | POA: Insufficient documentation

## 2023-02-11 DIAGNOSIS — H2512 Age-related nuclear cataract, left eye: Secondary | ICD-10-CM | POA: Diagnosis present

## 2023-02-11 DIAGNOSIS — M810 Age-related osteoporosis without current pathological fracture: Secondary | ICD-10-CM | POA: Insufficient documentation

## 2023-02-11 DIAGNOSIS — H409 Unspecified glaucoma: Secondary | ICD-10-CM | POA: Diagnosis not present

## 2023-02-11 DIAGNOSIS — M8580 Other specified disorders of bone density and structure, unspecified site: Secondary | ICD-10-CM | POA: Insufficient documentation

## 2023-02-11 HISTORY — DX: Presence of dental prosthetic device (complete) (partial): Z97.2

## 2023-02-11 HISTORY — PX: CATARACT EXTRACTION W/PHACO: SHX586

## 2023-02-11 SURGERY — PHACOEMULSIFICATION, CATARACT, WITH IOL INSERTION
Anesthesia: Monitor Anesthesia Care | Site: Eye | Laterality: Left

## 2023-02-11 MED ORDER — LACTATED RINGERS IV SOLN: INTRAVENOUS | Status: DC

## 2023-02-11 MED ORDER — SIGHTPATH DOSE#1 BSS IO SOLN
INTRAOCULAR | Status: DC | PRN
  Administered 2023-02-11: 1 mL

## 2023-02-11 MED ORDER — MIDAZOLAM HCL 2 MG/2ML IJ SOLN
INTRAMUSCULAR | Status: DC | PRN
  Administered 2023-02-11: 1 mg via INTRAVENOUS

## 2023-02-11 MED ORDER — BRIMONIDINE TARTRATE-TIMOLOL 0.2-0.5 % OP SOLN
OPHTHALMIC | Status: DC | PRN
  Administered 2023-02-11: 1 [drp] via OPHTHALMIC

## 2023-02-11 MED ORDER — MOXIFLOXACIN HCL 0.5 % OP SOLN
OPHTHALMIC | Status: DC | PRN
  Administered 2023-02-11: .2 mL via OPHTHALMIC

## 2023-02-11 MED ORDER — ARMC OPHTHALMIC DILATING DROPS
1.0000 | OPHTHALMIC | Status: DC | PRN
  Administered 2023-02-11 (×3): 1 via OPHTHALMIC

## 2023-02-11 MED ORDER — SIGHTPATH DOSE#1 NA CHONDROIT SULF-NA HYALURON 40-17 MG/ML IO SOLN
INTRAOCULAR | Status: DC | PRN
  Administered 2023-02-11: 1 mL via INTRAOCULAR

## 2023-02-11 MED ORDER — SIGHTPATH DOSE#1 BSS IO SOLN
INTRAOCULAR | Status: DC | PRN
  Administered 2023-02-11: 53 mL via OPHTHALMIC

## 2023-02-11 MED ORDER — FENTANYL CITRATE (PF) 100 MCG/2ML IJ SOLN
INTRAMUSCULAR | Status: DC | PRN
  Administered 2023-02-11: 50 ug via INTRAVENOUS

## 2023-02-11 MED ORDER — TETRACAINE HCL 0.5 % OP SOLN
1.0000 [drp] | OPHTHALMIC | Status: DC | PRN
  Administered 2023-02-11 (×3): 1 [drp] via OPHTHALMIC

## 2023-02-11 MED ORDER — SIGHTPATH DOSE#1 BSS IO SOLN
INTRAOCULAR | Status: DC | PRN
  Administered 2023-02-11: 15 mL

## 2023-02-11 SURGICAL SUPPLY — 14 items
CANNULA ANT/CHMB 27G (MISCELLANEOUS) IMPLANT
CANNULA ANT/CHMB 27GA (MISCELLANEOUS) IMPLANT
CATARACT SUITE SIGHTPATH (MISCELLANEOUS) ×1 IMPLANT
FEE CATARACT SUITE SIGHTPATH (MISCELLANEOUS) ×1 IMPLANT
GLOVE BIOGEL PI IND STRL 8 (GLOVE) ×1 IMPLANT
GLOVE SURG ENC TEXT LTX SZ8 (GLOVE) ×1 IMPLANT
LENS IOL TECNIS EYHANCE 23.5 (Intraocular Lens) IMPLANT
NDL FILTER BLUNT 18X1 1/2 (NEEDLE) ×1 IMPLANT
NEEDLE FILTER BLUNT 18X1 1/2 (NEEDLE) ×1 IMPLANT
PACK VIT ANT 23G (MISCELLANEOUS) IMPLANT
RING MALYGIN (MISCELLANEOUS) IMPLANT
SUT ETHILON 10-0 CS-B-6CS-B-6 (SUTURE)
SUTURE EHLN 10-0 CS-B-6CS-B-6 (SUTURE) IMPLANT
SYR 3ML LL SCALE MARK (SYRINGE) ×1 IMPLANT

## 2023-02-11 NOTE — Anesthesia Preprocedure Evaluation (Signed)
Anesthesia Evaluation  Patient identified by MRN, date of birth, ID band Patient awake    Reviewed: Allergy & Precautions, H&P , NPO status , reviewed documented beta blocker date and time   Airway Mallampati: II  TM Distance: >3 FB Neck ROM: limited    Dental  (+) Partial Lower, Chipped, Missing, Upper Dentures   Pulmonary    Pulmonary exam normal        Cardiovascular Normal cardiovascular exam     Neuro/Psych  Neuromuscular disease    GI/Hepatic ,GERD  Medicated and Controlled,,  Endo/Other    Renal/GU      Musculoskeletal  (+) Arthritis ,    Abdominal   Peds  Hematology   Anesthesia Other Findings Past Medical History: No date: Arthritis No date: Carpal tunnel syndrome No date: Cervical disc disease No date: Colon adenomas No date: Fat necrosis No date: GERD (gastroesophageal reflux disease) No date: Inguinal hernia No date: Neutropenia (HCC) No date: Osteopenia No date: Osteoporosis No date: Spondylolisthesis 2021: Ventral hernia Past Surgical History: 1996: ABDOMINAL HYSTERECTOMY 2011: BREAST EXCISIONAL BIOPSY; Left No date: COLONOSCOPY 08/18/2017: COLONOSCOPY WITH PROPOFOL; N/A     Comment:  Procedure: COLONOSCOPY WITH PROPOFOL;  Surgeon: Scot Jun, MD;  Location[facility] ENDOSCOPY;  Service:               Endoscopy;  Laterality: N/A; 2020: HERNIA REPAIR; Left 2000: HERNIA REPAIR; Right 11/19/2018: INGUINAL HERNIA REPAIR; Left     Comment:  Procedure: HERNIA REPAIR INGUINAL ADULT;  Surgeon:               Sung Amabile, DO;  Location: [facility] ORS;  Service: General;              Laterality: Left; 2009: SHOULDER SURGERY; Right     Comment:  injury repair BMI    Body Mass Index: 28.71 kg/m     Reproductive/Obstetrics                             Anesthesia Physical Anesthesia Plan  ASA: II  Anesthesia Plan: MAC   Post-op Pain Management:     Induction: Intravenous  PONV Risk Score and Plan: 2 and TIVA and Midazolam  Airway Management Planned: Natural Airway and Nasal Cannula  Additional Equipment:   Intra-op Plan:   Post-operative Plan:   Informed Consent: I have reviewed the patients History and Physical, chart, labs and discussed the procedure including the risks, benefits and alternatives for the proposed anesthesia with the patient or authorized representative who has indicated his/her understanding and acceptance.     Dental Advisory Given  Plan Discussed with: CRNA  Anesthesia Plan Comments: (Patient consented for risks of anesthesia including but not limited to:  - adverse reactions to medications - damage to eyes, teeth, lips or other oral mucosa - nerve damage due to positioning  - sore throat or hoarseness - Damage to heart, brain, nerves, lungs, other parts of body or loss of life  Patient voiced understanding.)        Anesthesia Quick Evaluation

## 2023-02-11 NOTE — Transfer of Care (Signed)
Immediate Anesthesia Transfer of Care Note  Patient: Rebecca Salinas  Procedure(s) Performed: CATARACT EXTRACTION PHACO AND INTRAOCULAR LENS PLACEMENT (IOC) LEFT (Left: Eye)  Patient Location: PACU  Anesthesia Type: MAC  Level of Consciousness: awake, alert  and patient cooperative  Airway and Oxygen Therapy: Patient Spontanous Breathing and Patient connected to supplemental oxygen  Post-op Assessment: Post-op Vital signs reviewed, Patient's Cardiovascular Status Stable, Respiratory Function Stable, Patent Airway and No signs of Nausea or vomiting  Post-op Vital Signs: Reviewed and stable  Complications: No notable events documented.

## 2023-02-11 NOTE — Op Note (Signed)
PREOPERATIVE DIAGNOSIS:  Nuclear sclerotic cataract of the left eye.   POSTOPERATIVE DIAGNOSIS:  Nuclear sclerotic cataract of the left eye.   OPERATIVE PROCEDURE:ORPROCALL@   SURGEON:  Galen Manila, MD.   ANESTHESIA:  Anesthesiologist: Stephanie Coup, MD CRNA: Barbette Hair, CRNA  1.      Managed anesthesia care. 2.     0.67ml of Shugarcaine was instilled following the paracentesis   COMPLICATIONS:  None.   TECHNIQUE:   Stop and chop   DESCRIPTION OF PROCEDURE:  The patient was examined and consented in the preoperative holding area where the aforementioned topical anesthesia was applied to the left eye and then brought back to the Operating Room where the left eye was prepped and draped in the usual sterile ophthalmic fashion and a lid speculum was placed. A paracentesis was created with the side port blade and the anterior chamber was filled with viscoelastic. A near clear corneal incision was performed with the steel keratome. A continuous curvilinear capsulorrhexis was performed with a cystotome followed by the capsulorrhexis forceps. Hydrodissection and hydrodelineation were carried out with BSS on a blunt cannula. The lens was removed in a stop and chop  technique and the remaining cortical material was removed with the irrigation-aspiration handpiece. The capsular bag was inflated with viscoelastic and the Technis [id number] lens was placed in the capsular bag without complication. The remaining viscoelastic was removed from the eye with the irrigation-aspiration handpiece. The wounds were hydrated. The anterior chamber was flushed with BSS and the eye was inflated to physiologic pressure. 0.50ml Vigamox was placed in the anterior chamber. The wounds were found to be water tight. The eye was dressed with Combigan. The patient was given protective glasses to wear throughout the day and a shield with which to sleep tonight. The patient was also given drops with which to begin a drop regimen  today and will follow-up with me in one day. Implant Name Type Inv. Item Serial No. Manufacturer Lot No. LRB No. Used Action  LENS IOL TECNIS EYHANCE 23.5 - [id number] Intraocular Lens LENS IOL TECNIS EYHANCE 23.5 [id number] [employer]  Left 1 Implanted    Procedure(s) with comments: CATARACT EXTRACTION PHACO AND INTRAOCULAR LENS PLACEMENT (IOC) LEFT (Left) - 6.77 0:42.1  Electronically signed: Galen Manila 02/11/2023 9:34 AM

## 2023-02-11 NOTE — H&P (Signed)
[facility]   Primary Care Physician:  Schermerhorn, Ihor Austin, MD Ophthalmologist: Dr. Druscilla Brownie  Pre-Procedure History & Physical: HPI:  Rebecca Salinas is a 77 y.o. female here for cataract surgery.   Past Medical History:  Diagnosis Date   Arthritis    Carpal tunnel syndrome    Cervical disc disease    Colon adenomas    Fat necrosis    GERD (gastroesophageal reflux disease)    Glaucoma    Inguinal hernia    Neutropenia    Osteopenia    Osteoporosis    Spondylolisthesis    Ventral hernia 2021   Wears dentures    full upper    Past Surgical History:  Procedure Laterality Date   ABDOMINAL HYSTERECTOMY  1996   BREAST EXCISIONAL BIOPSY Left 2011   COLONOSCOPY     COLONOSCOPY WITH PROPOFOL N/A 08/18/2017   Procedure: COLONOSCOPY WITH PROPOFOL;  Surgeon: Scot Jun, MD;  Location: [facility] ENDOSCOPY;  Service: Endoscopy;  Laterality: N/A;   COLONOSCOPY WITH PROPOFOL N/A 09/11/2022   Procedure: COLONOSCOPY WITH PROPOFOL;  Surgeon: Toledo, Boykin Nearing, MD;  Location: [facility] ENDOSCOPY;  Service: Gastroenterology;  Laterality: N/A;   HERNIA REPAIR Left 2020   HERNIA REPAIR Right 2000   INGUINAL HERNIA REPAIR Left 11/19/2018   Procedure: HERNIA REPAIR INGUINAL ADULT;  Surgeon: Sung Amabile, DO;  Location: [facility] ORS;  Service: General;  Laterality: Left;   SHOULDER SURGERY Right 2009   injury repair   VENTRAL HERNIA REPAIR N/A 08/17/2020   Procedure: HERNIA REPAIR VENTRAL ADULT;  Surgeon: Sung Amabile, DO;  Location: [facility] ORS;  Service: General;  Laterality: N/A;    Prior to Admission medications   Medication Sig Start Date End Date Taking? Authorizing Provider  Bimatoprost (LUMIGAN OP) Apply to eye at bedtime.   Yes [provider]  brimonidine-timolol (COMBIGAN) 0.2-0.5 % ophthalmic solution Place 1 drop into both eyes every 12 (twelve) hours.   Yes [provider]  Cetirizine HCl (ZYRTEC ALLERGY) 10 MG CAPS Take 1 capsule by mouth daily.   Yes [provider]  esomeprazole (NEXIUM) 40 MG capsule Take 40 mg by mouth every morning.    Yes [provider]  vitamin C (ASCORBIC ACID) 500 MG tablet Take 500 mg by mouth as needed.    Yes [provider]  VITAMIN D PO Take by mouth.   Yes [provider]    Allergies as of 01/17/2023 - Review Complete 09/11/2022  Allergen Reaction Noted   Norflex [orphenadrine citrate]  08/15/2017   Lodine [etodolac] Itching and Rash 08/15/2017    Family History  Problem Relation Age of Onset   Diabetes Mother    Lung disease Father    Breast cancer Neg Hx     Social History   Socioeconomic History   Marital status: Married    Spouse name: Not on file   Number of children: Not on file   Years of education: Not on file   Highest education level: Not on file  Occupational History   Not on file  Tobacco Use   Smoking status: Never   Smokeless tobacco: Never  Vaping Use   Vaping Use: Never used  Substance and Sexual Activity   Alcohol use: Yes    Comment: rare WINE   Drug use: No   Sexual activity: Not on file  Other Topics Concern   Not on file  Social History Narrative   Not on file   Social Determinants of Health  Financial Resource Strain: Not on file  Food Insecurity: Not on file  Transportation Needs: Not on file  Physical Activity: Not on file  Stress: Not on file  Social Connections: Not on file  Intimate Partner Violence: Not on file    Review of Systems: See HPI, otherwise negative ROS  Physical Exam: BP (!) 142/89   Pulse 78   Temp (!) 97.5 F (36.4 C) (Temporal)   Resp 16   Ht  (1.575 m)   Wt 70.3 kg   SpO2 99%   BMI 28.35 kg/m  General:   Alert, cooperative in NAD Head:  Normocephalic and atraumatic. Respiratory:  Normal work of breathing. Cardiovascular:  RRR  Impression/Plan: Rebecca Salinas is here for cataract surgery.  Risks, benefits, limitations, and alternatives regarding cataract surgery have been reviewed  with the patient.  Questions have been answered.  All parties agreeable.   Galen Manila, MD  02/11/2023, 9:13 AM

## 2023-02-11 NOTE — Anesthesia Postprocedure Evaluation (Signed)
Anesthesia Post Note  Patient: Cooper L Seat  Procedure(s) Performed: CATARACT EXTRACTION PHACO AND INTRAOCULAR LENS PLACEMENT (IOC) LEFT (Left: Eye)  Patient location during evaluation: PACU Anesthesia Type: MAC Level of consciousness: awake and alert Pain management: pain level controlled Vital Signs Assessment: post-procedure vital signs reviewed and stable Respiratory status: spontaneous breathing, nonlabored ventilation, respiratory function stable and patient connected to nasal cannula oxygen Cardiovascular status: stable and blood pressure returned to baseline Postop Assessment: no apparent nausea or vomiting Anesthetic complications: no  No notable events documented.   Last Vitals:  Vitals:   02/11/23 0935 02/11/23 0940  BP: 121/76 134/75  Pulse: 63   Resp: 18   Temp: (!) 36.4 C (!) 36.3 C  SpO2: 97%     Last Pain:  Vitals:   02/11/23 0940  TempSrc:   PainSc: 0-No pain                 Stephanie Coup

## 2023-02-12 ENCOUNTER — Encounter: Payer: Self-pay | Admitting: Ophthalmology

## 2023-02-18 ENCOUNTER — Encounter: Payer: Self-pay | Admitting: Ophthalmology

## 2023-02-21 NOTE — Discharge Instructions (Signed)

## 2023-02-25 ENCOUNTER — Ambulatory Visit: Payer: Medicare Other | Admitting: Anesthesiology

## 2023-02-25 ENCOUNTER — Encounter: Payer: Self-pay | Admitting: Ophthalmology

## 2023-02-25 ENCOUNTER — Encounter
Admission: RE | Disposition: A | Discharge status: Home / Self Care | Payer: Self-pay | Source: Home / Self Care | Attending: Ophthalmology

## 2023-02-25 ENCOUNTER — Ambulatory Visit
Admission: RE | Admit: 2023-02-25 | Discharge: 2023-02-25 | Disposition: A | Discharge status: Home / Self Care | Payer: Medicare Other | Attending: Ophthalmology | Admitting: Ophthalmology

## 2023-02-25 ENCOUNTER — Other Ambulatory Visit: Payer: Self-pay

## 2023-02-25 DIAGNOSIS — K219 Gastro-esophageal reflux disease without esophagitis: Secondary | ICD-10-CM | POA: Diagnosis not present

## 2023-02-25 DIAGNOSIS — H2511 Age-related nuclear cataract, right eye: Secondary | ICD-10-CM | POA: Insufficient documentation

## 2023-02-25 HISTORY — PX: CATARACT EXTRACTION W/PHACO: SHX586

## 2023-02-25 SURGERY — PHACOEMULSIFICATION, CATARACT, WITH IOL INSERTION
Anesthesia: Monitor Anesthesia Care | Site: Eye | Laterality: Right

## 2023-02-25 MED ORDER — TETRACAINE HCL 0.5 % OP SOLN
1.0000 [drp] | OPHTHALMIC | Status: DC | PRN
  Administered 2023-02-25 (×3): 1 [drp] via OPHTHALMIC

## 2023-02-25 MED ORDER — MIDAZOLAM HCL 2 MG/2ML IJ SOLN
INTRAMUSCULAR | Status: DC | PRN
  Administered 2023-02-25: 1 mg via INTRAVENOUS

## 2023-02-25 MED ORDER — SIGHTPATH DOSE#1 BSS IO SOLN
INTRAOCULAR | Status: DC | PRN
  Administered 2023-02-25: 2 mL

## 2023-02-25 MED ORDER — FENTANYL CITRATE (PF) 100 MCG/2ML IJ SOLN
INTRAMUSCULAR | Status: DC | PRN
  Administered 2023-02-25: 50 ug via INTRAVENOUS

## 2023-02-25 MED ORDER — SIGHTPATH DOSE#1 BSS IO SOLN
INTRAOCULAR | Status: DC | PRN
  Administered 2023-02-25: 40 mL via OPHTHALMIC

## 2023-02-25 MED ORDER — SIGHTPATH DOSE#1 BSS IO SOLN
INTRAOCULAR | Status: DC | PRN
  Administered 2023-02-25: 15 mL via INTRAOCULAR

## 2023-02-25 MED ORDER — SIGHTPATH DOSE#1 NA CHONDROIT SULF-NA HYALURON 40-17 MG/ML IO SOLN
INTRAOCULAR | Status: DC | PRN
  Administered 2023-02-25: 1 mL via INTRAOCULAR

## 2023-02-25 MED ORDER — LACTATED RINGERS IV SOLN: INTRAVENOUS | Status: DC

## 2023-02-25 MED ORDER — MOXIFLOXACIN HCL 0.5 % OP SOLN
OPHTHALMIC | Status: DC | PRN
  Administered 2023-02-25: .2 mL via OPHTHALMIC

## 2023-02-25 MED ORDER — ARMC OPHTHALMIC DILATING DROPS
1.0000 | OPHTHALMIC | Status: DC | PRN
  Administered 2023-02-25 (×3): 1 via OPHTHALMIC

## 2023-02-25 MED ORDER — BRIMONIDINE TARTRATE-TIMOLOL 0.2-0.5 % OP SOLN
OPHTHALMIC | Status: DC | PRN
  Administered 2023-02-25: 1 [drp] via OPHTHALMIC

## 2023-02-25 SURGICAL SUPPLY — 14 items
CANNULA ANT/CHMB 27G (MISCELLANEOUS) IMPLANT
CANNULA ANT/CHMB 27GA (MISCELLANEOUS) IMPLANT
CATARACT SUITE SIGHTPATH (MISCELLANEOUS) ×1 IMPLANT
FEE CATARACT SUITE SIGHTPATH (MISCELLANEOUS) ×1 IMPLANT
GLOVE BIOGEL PI IND STRL 8 (GLOVE) ×1 IMPLANT
GLOVE SURG ENC TEXT LTX SZ8 (GLOVE) ×1 IMPLANT
LENS IOL TECNIS EYHANCE 23.0 (Intraocular Lens) IMPLANT
NDL FILTER BLUNT 18X1 1/2 (NEEDLE) ×1 IMPLANT
NEEDLE FILTER BLUNT 18X1 1/2 (NEEDLE) ×1 IMPLANT
PACK VIT ANT 23G (MISCELLANEOUS) IMPLANT
RING MALYGIN (MISCELLANEOUS) IMPLANT
SUT ETHILON 10-0 CS-B-6CS-B-6 (SUTURE)
SUTURE EHLN 10-0 CS-B-6CS-B-6 (SUTURE) IMPLANT
SYR 3ML LL SCALE MARK (SYRINGE) ×1 IMPLANT

## 2023-02-25 NOTE — H&P (Signed)
[facility]   Primary Care Physician:  Schermerhorn, Ihor Austin, MD Ophthalmologist: Dr. Druscilla Brownie  Pre-Procedure History & Physical: HPI:  Rebecca Salinas is a 77 y.o. female here for cataract surgery.   Past Medical History:  Diagnosis Date   Arthritis    Carpal tunnel syndrome    Cervical disc disease    Colon adenomas    Fat necrosis    GERD (gastroesophageal reflux disease)    Glaucoma    Inguinal hernia    Neutropenia (HCC)    Osteopenia    Osteoporosis    Spondylolisthesis    Ventral hernia 2021   Wears dentures    full upper    Past Surgical History:  Procedure Laterality Date   ABDOMINAL HYSTERECTOMY  1996   BREAST EXCISIONAL BIOPSY Left 2011   CATARACT EXTRACTION W/PHACO Left 02/11/2023   Procedure: CATARACT EXTRACTION PHACO AND INTRAOCULAR LENS PLACEMENT (IOC) LEFT;  Surgeon: Galen Manila, MD;  Location: [facility];  Service: Ophthalmology;  Laterality: Left;  6.77 0:42.1   COLONOSCOPY     COLONOSCOPY WITH PROPOFOL N/A 08/18/2017   Procedure: COLONOSCOPY WITH PROPOFOL;  Surgeon: Scot Jun, MD;  Location: [facility];  Service: Endoscopy;  Laterality: N/A;   COLONOSCOPY WITH PROPOFOL N/A 09/11/2022   Procedure: COLONOSCOPY WITH PROPOFOL;  Surgeon: Toledo, Boykin Nearing, MD;  Location: [facility] END[facility];  Service: Gastroenterology;  Laterality: N/A;   HERNIA REPAIR Left 2020   HERNIA REPAIR Right 2000   INGUINAL HERNIA REPAIR Left 11/19/2018   Procedure: HERNIA REPAIR INGUINAL ADULT;  Surgeon: Sung Amabile, DO;  Location: [facility];  Service: General;  Laterality: Left;   SHOULDER SURGERY Right 2009   injury repair   VENTRAL HERNIA REPAIR N/A 08/17/2020   Procedure: HERNIA REPAIR VENTRAL ADULT;  Surgeon: Sung Amabile, DO;  Location: [facility];  Service: General;  Laterality: N/A;    Prior to Admission medications   Medication Sig Start Date End Date Taking? Authorizing Provider  Bimatoprost (LUMIGAN OP) Apply to eye at bedtime.   Yes  [provider]  brimonidine-timolol (COMBIGAN) 0.2-0.5 % ophthalmic solution Place 1 drop into both eyes every 12 (twelve) hours.   Yes [provider]  Cetirizine HCl (ZYRTEC ALLERGY) 10 MG CAPS Take 1 capsule by mouth daily.   Yes [provider]  esomeprazole (NEXIUM) 40 MG capsule Take 40 mg by mouth every morning.    Yes [provider]  vitamin C (ASCORBIC ACID) 500 MG tablet Take 500 mg by mouth as needed.    Yes [provider]  VITAMIN D PO Take by mouth.   Yes [provider]    Allergies as of 01/17/2023 - Review Complete 09/11/2022  Allergen Reaction Noted   Norflex [orphenadrine citrate]  08/15/2017   Lodine [etodolac] Itching and Rash 08/15/2017    Family History  Problem Relation Age of Onset   Diabetes Mother    Lung disease Father    Breast cancer Neg Hx     Social History   Socioeconomic History   Marital status: Married    Spouse name: Not on file   Number of children: Not on file   Years of education: Not on file   Highest education level: Not on file  Occupational History   Not on file  Tobacco Use   Smoking status: Never   Smokeless tobacco: Never  Vaping Use   Vaping Use: Never used  Substance and Sexual Activity   Alcohol use: Yes  Comment: rare WINE   Drug use: No   Sexual activity: Not on file  Other Topics Concern   Not on file  Social History Narrative   Not on file   Social Determinants of Health   Financial Resource Strain: Not on file  Food Insecurity: Not on file  Transportation Needs: Not on file  Physical Activity: Not on file  Stress: Not on file  Social Connections: Not on file  Intimate Partner Violence: Not on file    Review of Systems: See HPI, otherwise negative ROS  Physical Exam: BP (!) 136/92   Temp (!) 97.3 F (36.3 C) (Tympanic)   Resp 16   Ht 5' 2.01" (1.575 m)   Wt 69.4 kg   SpO2 97%   BMI 27.98 kg/m  General:   Alert, cooperative in NAD Head:   Normocephalic and atraumatic. Respiratory:  Normal work of breathing. Cardiovascular:  RRR  Impression/Plan: Rebecca Salinas is here for cataract surgery.  Risks, benefits, limitations, and alternatives regarding cataract surgery have been reviewed with the patient.  Questions have been answered.  All parties agreeable.   Galen Manila, MD  02/25/2023, 8:15 AM

## 2023-02-25 NOTE — Anesthesia Preprocedure Evaluation (Signed)
Anesthesia Evaluation  Patient identified by MRN, date of birth, ID band Patient awake    Reviewed: Allergy & Precautions, H&P , NPO status , Patient's Chart, lab work & pertinent test results  Airway Mallampati: I  TM Distance: >3 FB Neck ROM: Full    Dental  (+) Upper Dentures Some missing lower, none loose:   Pulmonary neg pulmonary ROS   Pulmonary exam normal breath sounds clear to auscultation       Cardiovascular negative cardio ROS Normal cardiovascular exam Rhythm:Regular Rate:Normal     Neuro/Psych negative neurological ROS  negative psych ROS   GI/Hepatic Neg liver ROS,GERD  Controlled and Medicated,,  Endo/Other  negative endocrine ROS    Renal/GU negative Renal ROS  negative genitourinary   Musculoskeletal negative musculoskeletal ROS (+)    Abdominal   Peds negative pediatric ROS (+)  Hematology negative hematology ROS (+)   Anesthesia Other Findings   Reproductive/Obstetrics negative OB ROS                             Anesthesia Physical Anesthesia Plan  ASA: 2  Anesthesia Plan: MAC   Post-op Pain Management:    Induction: Intravenous  PONV Risk Score and Plan:   Airway Management Planned: Natural Airway and Nasal Cannula  Additional Equipment:   Intra-op Plan:   Post-operative Plan:   Informed Consent: I have reviewed the patients History and Physical, chart, labs and discussed the procedure including the risks, benefits and alternatives for the proposed anesthesia with the patient or authorized representative who has indicated his/her understanding and acceptance.     Dental Advisory Given  Plan Discussed with: Anesthesiologist, CRNA and Surgeon  Anesthesia Plan Comments: (Patient consented for risks of anesthesia including but not limited to:  - adverse reactions to medications - damage to eyes, teeth, lips or other oral mucosa - nerve damage due to  positioning  - sore throat or hoarseness - Damage to heart, brain, nerves, lungs, other parts of body or loss of life  Patient voiced understanding.)       Anesthesia Quick Evaluation

## 2023-02-25 NOTE — Transfer of Care (Signed)
Immediate Anesthesia Transfer of Care Note  Patient: Rebecca Salinas  Procedure(s) Performed: CATARACT EXTRACTION PHACO AND INTRAOCULAR LENS PLACEMENT (IOC) RIGHT 5.83 00:35.7 (Right: Eye)  Patient Location: PACU  Anesthesia Type:MAC  Level of Consciousness: awake and alert   Airway & Oxygen Therapy: Patient Spontanous Breathing  Post-op Assessment: Report given to RN  Post vital signs: Reviewed and stable  Last Vitals:  Vitals Value Taken Time  BP 109/59 02/25/23 0839  Temp 36.2 C 02/25/23 0839  Pulse 73 02/25/23 0841  Resp 19 02/25/23 0841  SpO2 97 % 02/25/23 0841  Vitals shown include unvalidated device data.  Last Pain:  Vitals:   02/25/23 0839  TempSrc:   PainSc: 0-No pain         Complications: No notable events documented.

## 2023-02-25 NOTE — Op Note (Signed)
PREOPERATIVE DIAGNOSIS:  Nuclear sclerotic cataract of the right eye.   POSTOPERATIVE DIAGNOSIS:  Cataract   OPERATIVE PROCEDURE:ORPROCALL@   SURGEON:  Galen Manila, MD.   ANESTHESIA:  CRNA: Genia Del, CRNA  1.      Managed anesthesia care. 2.      0.84ml of Shugarcaine was instilled in the eye following the paracentesis.   COMPLICATIONS:  None.   TECHNIQUE:   Stop and chop   DESCRIPTION OF PROCEDURE:  The patient was examined and consented in the preoperative holding area where the aforementioned topical anesthesia was applied to the right eye and then brought back to the Operating Room where the right eye was prepped and draped in the usual sterile ophthalmic fashion and a lid speculum was placed. A paracentesis was created with the side port blade and the anterior chamber was filled with viscoelastic. A near clear corneal incision was performed with the steel keratome. A continuous curvilinear capsulorrhexis was performed with a cystotome followed by the capsulorrhexis forceps. Hydrodissection and hydrodelineation were carried out with BSS on a blunt cannula. The lens was removed in a stop and chop  technique and the remaining cortical material was removed with the irrigation-aspiration handpiece. The capsular bag was inflated with viscoelastic and the Technis [id number]  lens was placed in the capsular bag without complication. The remaining viscoelastic was removed from the eye with the irrigation-aspiration handpiece. The wounds were hydrated. The anterior chamber was flushed with BSS and the eye was inflated to physiologic pressure. 0.55ml of Vigamox was placed in the anterior chamber. The wounds were found to be water tight. The eye was dressed with Combigan. The patient was given protective glasses to wear throughout the day and a shield with which to sleep tonight. The patient was also given drops with which to begin a drop regimen today and will follow-up with me in one day. Implant  Name Type Inv. Item Serial No. Manufacturer Lot No. LRB No. Used Action  LENS IOL TECNIS EYHANCE 23.0 - [id number] Intraocular Lens LENS IOL TECNIS EYHANCE 23.0 [id number] [employer]  Right 1 Implanted   Procedure(s): CATARACT EXTRACTION PHACO AND INTRAOCULAR LENS PLACEMENT (IOC) RIGHT 5.83 00:35.7 (Right)  Electronically signed: Galen Manila 02/25/2023 8:38 AM

## 2023-02-25 NOTE — Anesthesia Postprocedure Evaluation (Signed)
Anesthesia Post Note  Patient: Rebecca Salinas  Procedure(s) Performed: CATARACT EXTRACTION PHACO AND INTRAOCULAR LENS PLACEMENT (IOC) RIGHT 5.83 00:35.7 (Right: Eye)  Patient location during evaluation: PACU Anesthesia Type: MAC Level of consciousness: awake and alert Pain management: pain level controlled Vital Signs Assessment: post-procedure vital signs reviewed and stable Respiratory status: spontaneous breathing, nonlabored ventilation, respiratory function stable and patient connected to nasal cannula oxygen Cardiovascular status: stable and blood pressure returned to baseline Postop Assessment: no apparent nausea or vomiting Anesthetic complications: no   No notable events documented.   Last Vitals:  Vitals:   02/25/23 0839 02/25/23 0843  BP: (!) 109/59 119/75  Pulse: 77 67  Resp: 14 15  Temp: (!) 36.2 C (!) 36.2 C  SpO2: 98% 97%    Last Pain:  Vitals:   02/25/23 0843  TempSrc:   PainSc: 0-No pain                 Bevely Palmer Kalena Mander

## 2023-02-26 ENCOUNTER — Encounter: Payer: Self-pay | Admitting: Ophthalmology

## 2023-09-10 ENCOUNTER — Other Ambulatory Visit: Payer: Self-pay | Admitting: Obstetrics and Gynecology

## 2023-09-10 DIAGNOSIS — Z1231 Encounter for screening mammogram for malignant neoplasm of breast: Secondary | ICD-10-CM

## 2023-10-01 IMAGING — MG MM DIGITAL SCREENING BILAT W/ TOMO AND CAD
6 of 10 series · 6 of 30 positions shown · non-contrast
Comparison: Previous exam(s).

ACR Breast Density Category a: The breast tissue is almost entirely
fatty.

CLINICAL DATA: Screening.

EXAM:
DIGITAL SCREENING BILATERAL MAMMOGRAM WITH TOMOSYNTHESIS AND CAD
TECHNIQUE: Bilateral screening digital craniocaudal and mediolateral oblique
mammograms were obtained. Bilateral screening digital breast
tomosynthesis was performed. The images were evaluated with
computer-aided detection.

[L CC synth-2D]
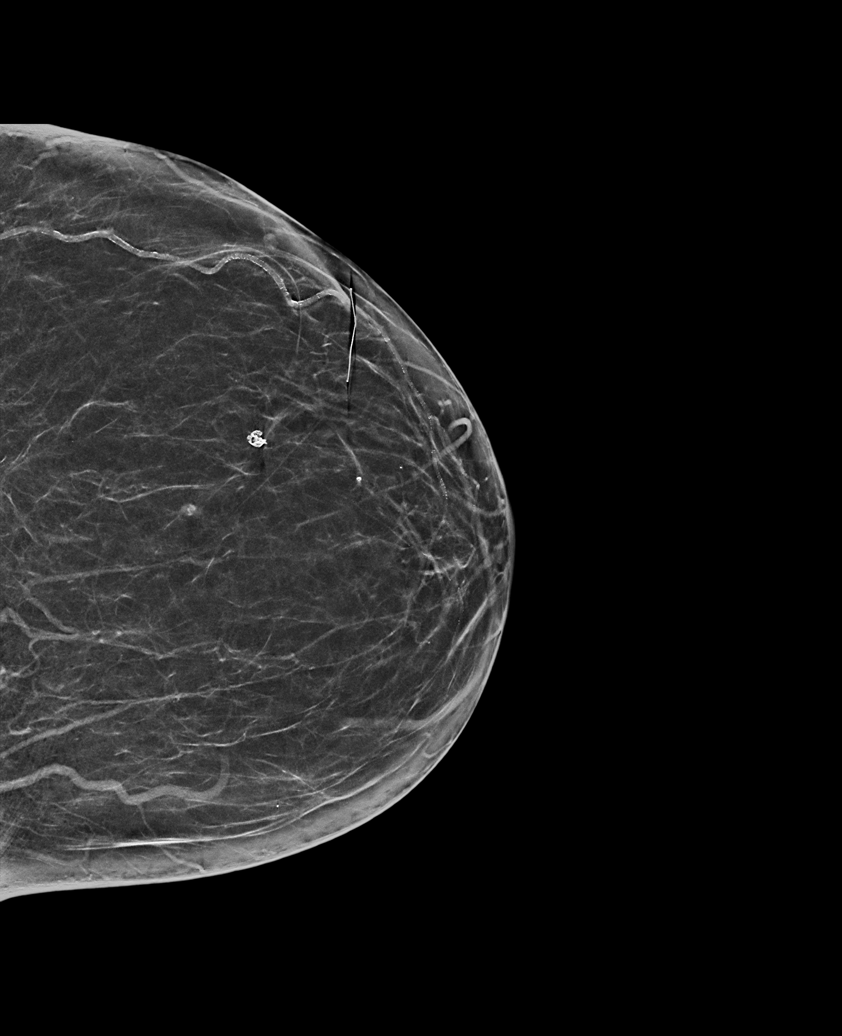

[L MLO synth-2D]
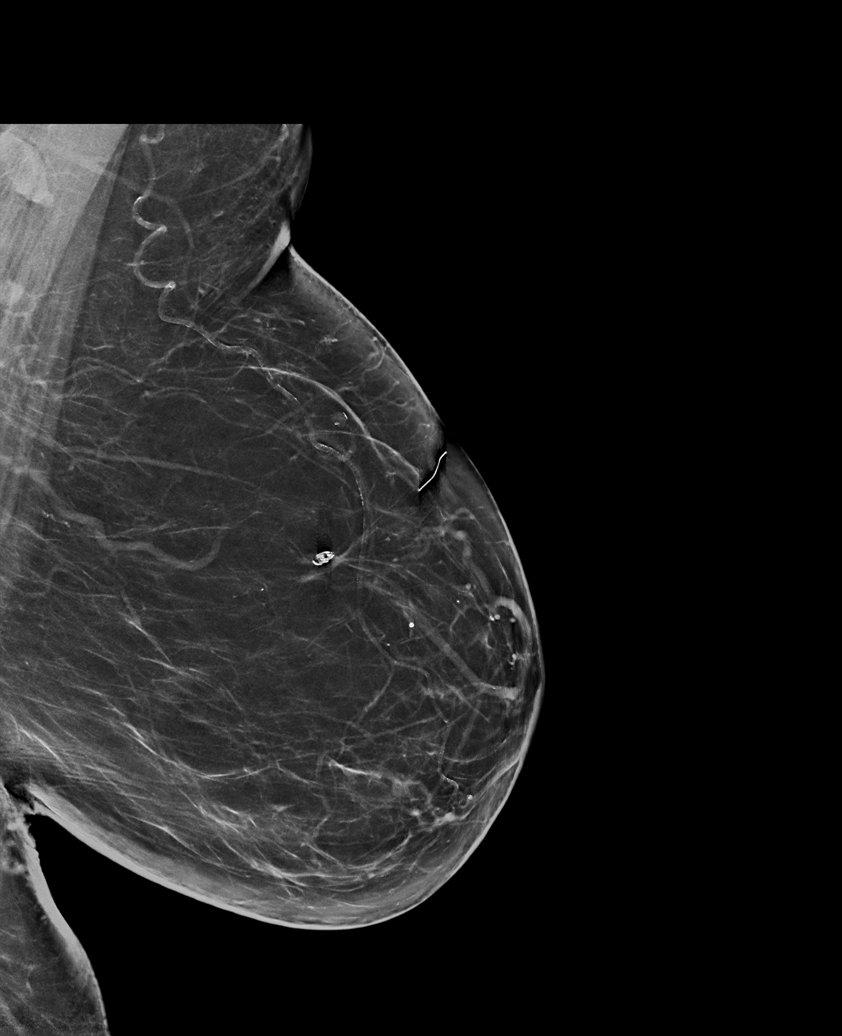

[R MLO synth-2D (1 of 2)]
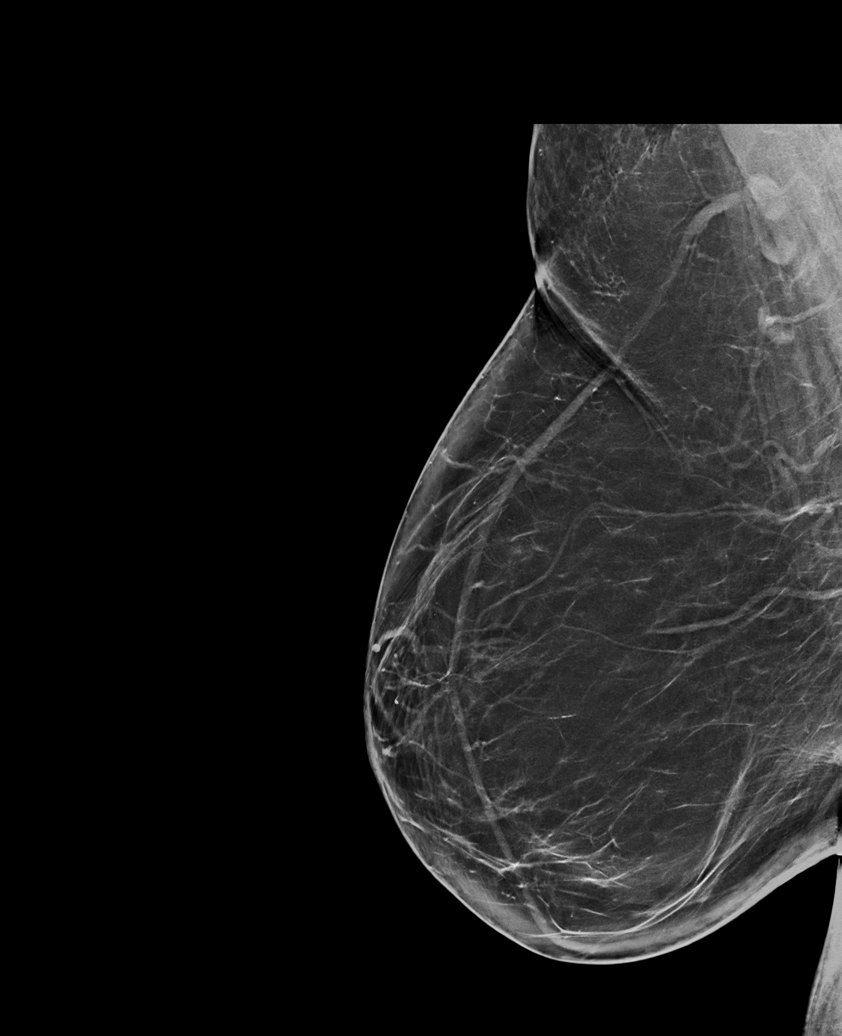

[R MLO synth-2D (2 of 2)]
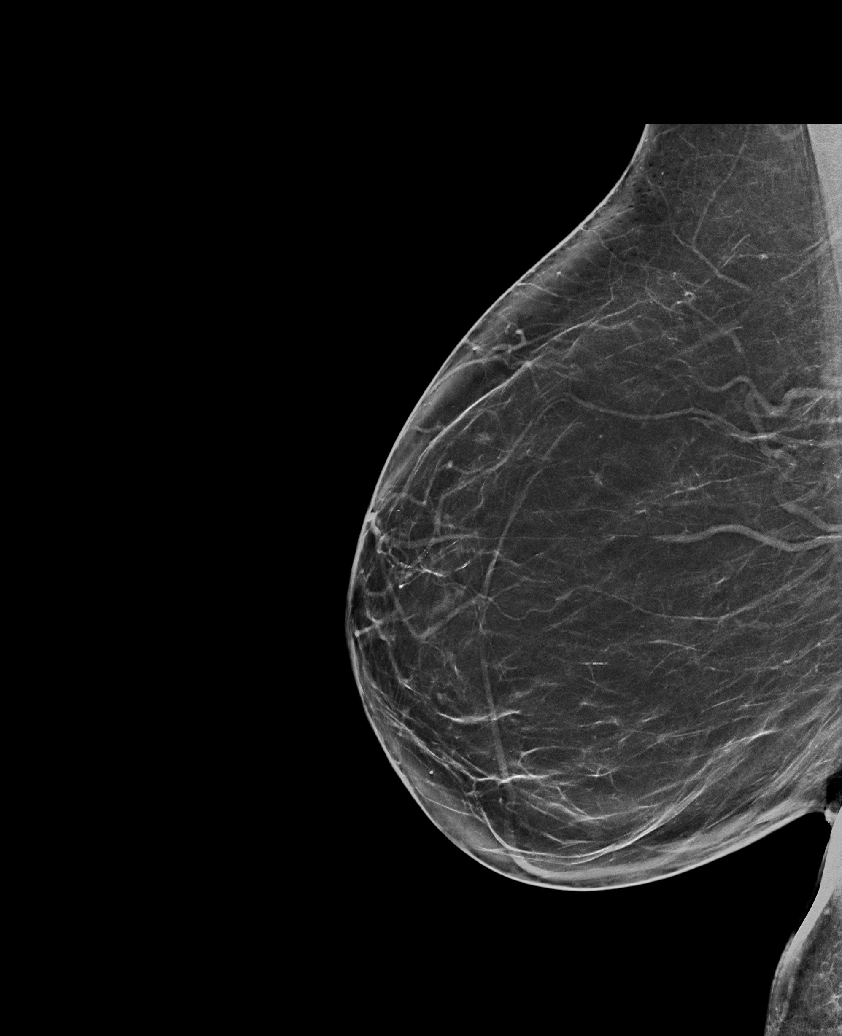

[R CC synth-2D]
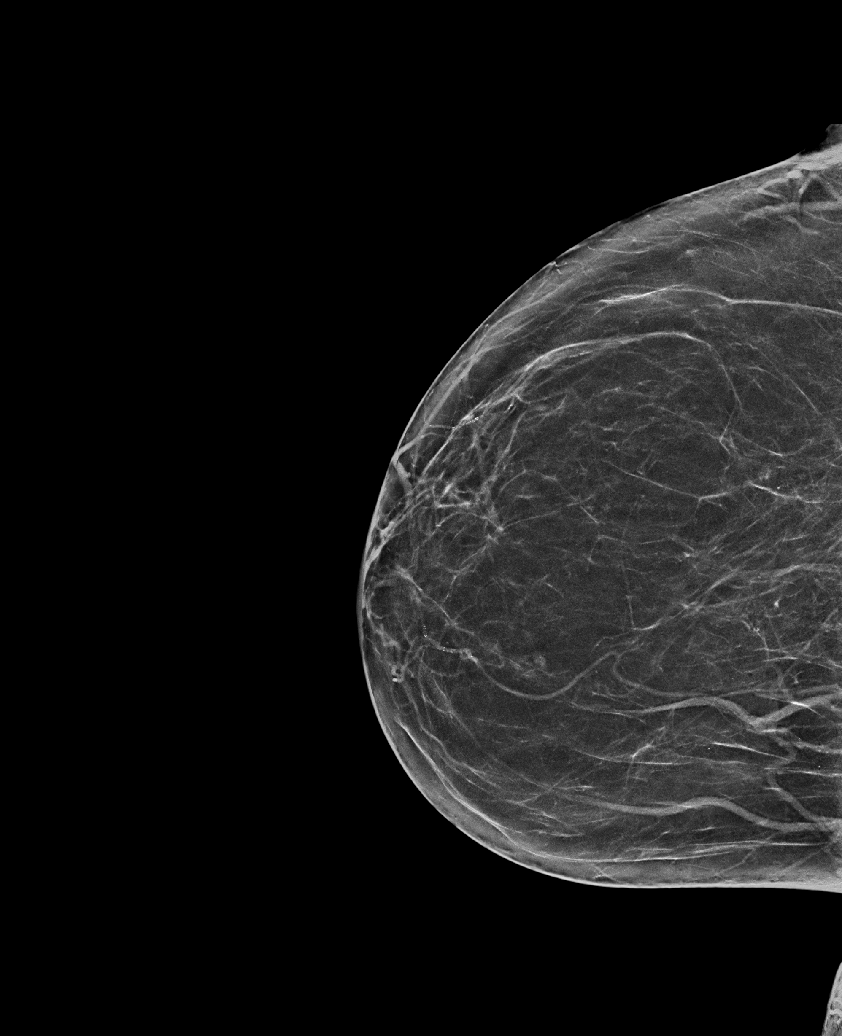

[L CC tomo · tomo slice 33/65.0]
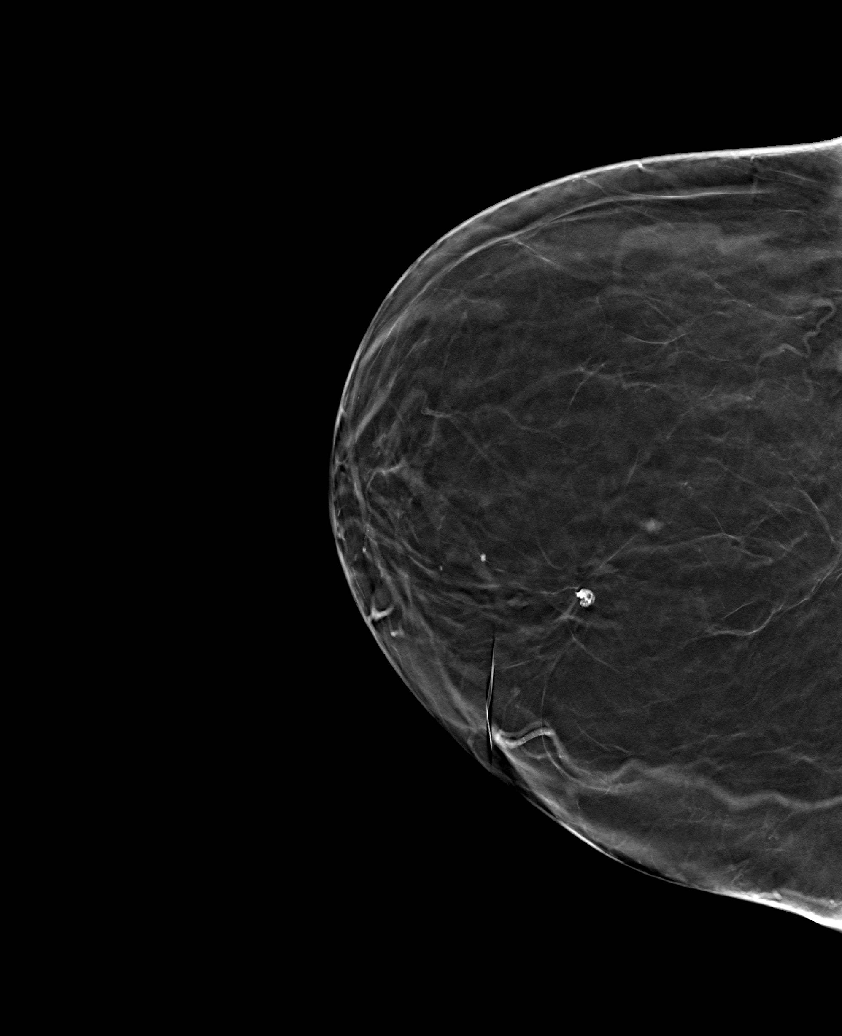

[6 of 30 positions shown; findings below may reference images not displayed]

FINDINGS: There are no findings suspicious for malignancy.
IMPRESSION: No mammographic evidence of malignancy. A result letter of this
screening mammogram will be mailed directly to the patient.

RECOMMENDATION:
Screening mammogram in one year. (Code:0E-3-N98)

BI-RADS CATEGORY  1: Negative.

## 2023-10-17 ENCOUNTER — Ambulatory Visit
Admission: RE | Admit: 2023-10-17 | Discharge: 2023-10-17 | Disposition: A | Discharge status: Home / Self Care | Payer: Medicare Other | Source: Ambulatory Visit | Attending: Obstetrics and Gynecology | Admitting: Obstetrics and Gynecology

## 2023-10-17 DIAGNOSIS — Z1231 Encounter for screening mammogram for malignant neoplasm of breast: Secondary | ICD-10-CM | POA: Insufficient documentation

## 2024-09-13 ENCOUNTER — Other Ambulatory Visit: Payer: Self-pay | Admitting: Internal Medicine

## 2024-09-13 DIAGNOSIS — Z1231 Encounter for screening mammogram for malignant neoplasm of breast: Secondary | ICD-10-CM

## 2024-10-19 ENCOUNTER — Ambulatory Visit
Admission: RE | Admit: 2024-10-19 | Discharge: 2024-10-19 | Disposition: A | Discharge status: Home / Self Care | Source: Ambulatory Visit | Attending: Internal Medicine | Admitting: Internal Medicine

## 2024-10-19 DIAGNOSIS — Z1231 Encounter for screening mammogram for malignant neoplasm of breast: Secondary | ICD-10-CM | POA: Insufficient documentation
